# Patient Record
Sex: Female | Born: 1935
Health system: Southern US, Community
[De-identification: ages and names within clinical notes are randomized; demographics above are authoritative.]

---

## 2011-11-14 DIAGNOSIS — E039 Hypothyroidism, unspecified: Secondary | ICD-10-CM | POA: Diagnosis not present

## 2011-11-14 DIAGNOSIS — I10 Essential (primary) hypertension: Secondary | ICD-10-CM | POA: Diagnosis not present

## 2011-11-14 DIAGNOSIS — R5381 Other malaise: Secondary | ICD-10-CM | POA: Diagnosis not present

## 2011-11-14 DIAGNOSIS — E559 Vitamin D deficiency, unspecified: Secondary | ICD-10-CM | POA: Diagnosis not present

## 2011-11-14 DIAGNOSIS — D539 Nutritional anemia, unspecified: Secondary | ICD-10-CM | POA: Diagnosis not present

## 2011-11-14 DIAGNOSIS — E785 Hyperlipidemia, unspecified: Secondary | ICD-10-CM | POA: Diagnosis not present

## 2011-11-14 DIAGNOSIS — Z0000 Encounter for general adult medical examination without abnormal findings: Secondary | ICD-10-CM | POA: Diagnosis not present

## 2011-11-21 DIAGNOSIS — Z6832 Body mass index (BMI) 32.0-32.9, adult: Secondary | ICD-10-CM | POA: Diagnosis not present

## 2011-11-21 DIAGNOSIS — J069 Acute upper respiratory infection, unspecified: Secondary | ICD-10-CM | POA: Diagnosis not present

## 2011-11-21 DIAGNOSIS — D539 Nutritional anemia, unspecified: Secondary | ICD-10-CM | POA: Diagnosis not present

## 2011-11-21 DIAGNOSIS — K219 Gastro-esophageal reflux disease without esophagitis: Secondary | ICD-10-CM | POA: Diagnosis not present

## 2011-11-27 DIAGNOSIS — D510 Vitamin B12 deficiency anemia due to intrinsic factor deficiency: Secondary | ICD-10-CM | POA: Diagnosis not present

## 2011-12-04 DIAGNOSIS — D510 Vitamin B12 deficiency anemia due to intrinsic factor deficiency: Secondary | ICD-10-CM | POA: Diagnosis not present

## 2011-12-11 DIAGNOSIS — D510 Vitamin B12 deficiency anemia due to intrinsic factor deficiency: Secondary | ICD-10-CM | POA: Diagnosis not present

## 2011-12-11 DIAGNOSIS — Z6833 Body mass index (BMI) 33.0-33.9, adult: Secondary | ICD-10-CM | POA: Diagnosis not present

## 2012-01-02 DIAGNOSIS — H26499 Other secondary cataract, unspecified eye: Secondary | ICD-10-CM | POA: Diagnosis not present

## 2012-01-15 DIAGNOSIS — D510 Vitamin B12 deficiency anemia due to intrinsic factor deficiency: Secondary | ICD-10-CM | POA: Diagnosis not present

## 2012-01-22 DIAGNOSIS — Z1231 Encounter for screening mammogram for malignant neoplasm of breast: Secondary | ICD-10-CM | POA: Diagnosis not present

## 2012-02-19 DIAGNOSIS — D510 Vitamin B12 deficiency anemia due to intrinsic factor deficiency: Secondary | ICD-10-CM | POA: Diagnosis not present

## 2012-03-02 DIAGNOSIS — J019 Acute sinusitis, unspecified: Secondary | ICD-10-CM | POA: Diagnosis not present

## 2012-03-02 DIAGNOSIS — Z6832 Body mass index (BMI) 32.0-32.9, adult: Secondary | ICD-10-CM | POA: Diagnosis not present

## 2012-03-23 DIAGNOSIS — E039 Hypothyroidism, unspecified: Secondary | ICD-10-CM | POA: Diagnosis not present

## 2012-03-23 DIAGNOSIS — E785 Hyperlipidemia, unspecified: Secondary | ICD-10-CM | POA: Diagnosis not present

## 2012-03-23 DIAGNOSIS — I10 Essential (primary) hypertension: Secondary | ICD-10-CM | POA: Diagnosis not present

## 2012-03-23 DIAGNOSIS — D539 Nutritional anemia, unspecified: Secondary | ICD-10-CM | POA: Diagnosis not present

## 2012-03-23 DIAGNOSIS — Z6832 Body mass index (BMI) 32.0-32.9, adult: Secondary | ICD-10-CM | POA: Diagnosis not present

## 2012-03-23 DIAGNOSIS — D510 Vitamin B12 deficiency anemia due to intrinsic factor deficiency: Secondary | ICD-10-CM | POA: Diagnosis not present

## 2012-04-01 DIAGNOSIS — J449 Chronic obstructive pulmonary disease, unspecified: Secondary | ICD-10-CM | POA: Diagnosis not present

## 2012-04-01 DIAGNOSIS — R0982 Postnasal drip: Secondary | ICD-10-CM | POA: Diagnosis not present

## 2012-04-01 DIAGNOSIS — R0902 Hypoxemia: Secondary | ICD-10-CM | POA: Diagnosis not present

## 2012-04-29 DIAGNOSIS — D510 Vitamin B12 deficiency anemia due to intrinsic factor deficiency: Secondary | ICD-10-CM | POA: Diagnosis not present

## 2012-06-03 DIAGNOSIS — D510 Vitamin B12 deficiency anemia due to intrinsic factor deficiency: Secondary | ICD-10-CM | POA: Diagnosis not present

## 2012-07-15 DIAGNOSIS — D510 Vitamin B12 deficiency anemia due to intrinsic factor deficiency: Secondary | ICD-10-CM | POA: Diagnosis not present

## 2012-07-29 DIAGNOSIS — Z6832 Body mass index (BMI) 32.0-32.9, adult: Secondary | ICD-10-CM | POA: Diagnosis not present

## 2012-07-29 DIAGNOSIS — E785 Hyperlipidemia, unspecified: Secondary | ICD-10-CM | POA: Diagnosis not present

## 2012-07-29 DIAGNOSIS — D539 Nutritional anemia, unspecified: Secondary | ICD-10-CM | POA: Diagnosis not present

## 2012-07-29 DIAGNOSIS — M5382 Other specified dorsopathies, cervical region: Secondary | ICD-10-CM | POA: Diagnosis not present

## 2012-07-29 DIAGNOSIS — Z23 Encounter for immunization: Secondary | ICD-10-CM | POA: Diagnosis not present

## 2012-07-29 DIAGNOSIS — I10 Essential (primary) hypertension: Secondary | ICD-10-CM | POA: Diagnosis not present

## 2012-08-12 DIAGNOSIS — M542 Cervicalgia: Secondary | ICD-10-CM | POA: Diagnosis not present

## 2012-08-14 DIAGNOSIS — M542 Cervicalgia: Secondary | ICD-10-CM | POA: Diagnosis not present

## 2012-08-17 DIAGNOSIS — M542 Cervicalgia: Secondary | ICD-10-CM | POA: Diagnosis not present

## 2012-08-19 DIAGNOSIS — M542 Cervicalgia: Secondary | ICD-10-CM | POA: Diagnosis not present

## 2012-08-19 DIAGNOSIS — D510 Vitamin B12 deficiency anemia due to intrinsic factor deficiency: Secondary | ICD-10-CM | POA: Diagnosis not present

## 2012-08-24 DIAGNOSIS — M542 Cervicalgia: Secondary | ICD-10-CM | POA: Diagnosis not present

## 2012-08-26 DIAGNOSIS — M542 Cervicalgia: Secondary | ICD-10-CM | POA: Diagnosis not present

## 2012-08-28 DIAGNOSIS — M542 Cervicalgia: Secondary | ICD-10-CM | POA: Diagnosis not present

## 2012-09-30 DIAGNOSIS — D510 Vitamin B12 deficiency anemia due to intrinsic factor deficiency: Secondary | ICD-10-CM | POA: Diagnosis not present

## 2012-11-04 DIAGNOSIS — J449 Chronic obstructive pulmonary disease, unspecified: Secondary | ICD-10-CM | POA: Diagnosis not present

## 2012-11-04 DIAGNOSIS — R0982 Postnasal drip: Secondary | ICD-10-CM | POA: Diagnosis not present

## 2012-11-04 DIAGNOSIS — R0902 Hypoxemia: Secondary | ICD-10-CM | POA: Diagnosis not present

## 2012-12-02 DIAGNOSIS — E785 Hyperlipidemia, unspecified: Secondary | ICD-10-CM | POA: Diagnosis not present

## 2012-12-02 DIAGNOSIS — Z6833 Body mass index (BMI) 33.0-33.9, adult: Secondary | ICD-10-CM | POA: Diagnosis not present

## 2012-12-02 DIAGNOSIS — I10 Essential (primary) hypertension: Secondary | ICD-10-CM | POA: Diagnosis not present

## 2012-12-02 DIAGNOSIS — D539 Nutritional anemia, unspecified: Secondary | ICD-10-CM | POA: Diagnosis not present

## 2012-12-02 DIAGNOSIS — D510 Vitamin B12 deficiency anemia due to intrinsic factor deficiency: Secondary | ICD-10-CM | POA: Diagnosis not present

## 2012-12-02 DIAGNOSIS — E039 Hypothyroidism, unspecified: Secondary | ICD-10-CM | POA: Diagnosis not present

## 2012-12-02 DIAGNOSIS — E559 Vitamin D deficiency, unspecified: Secondary | ICD-10-CM | POA: Diagnosis not present

## 2013-01-05 DIAGNOSIS — H26499 Other secondary cataract, unspecified eye: Secondary | ICD-10-CM | POA: Diagnosis not present

## 2013-01-06 DIAGNOSIS — D510 Vitamin B12 deficiency anemia due to intrinsic factor deficiency: Secondary | ICD-10-CM | POA: Diagnosis not present

## 2013-01-19 DIAGNOSIS — H524 Presbyopia: Secondary | ICD-10-CM | POA: Diagnosis not present

## 2013-02-10 DIAGNOSIS — D510 Vitamin B12 deficiency anemia due to intrinsic factor deficiency: Secondary | ICD-10-CM | POA: Diagnosis not present

## 2013-03-17 DIAGNOSIS — D510 Vitamin B12 deficiency anemia due to intrinsic factor deficiency: Secondary | ICD-10-CM | POA: Diagnosis not present

## 2013-04-07 DIAGNOSIS — Z1331 Encounter for screening for depression: Secondary | ICD-10-CM | POA: Diagnosis not present

## 2013-04-07 DIAGNOSIS — E039 Hypothyroidism, unspecified: Secondary | ICD-10-CM | POA: Diagnosis not present

## 2013-04-07 DIAGNOSIS — Z1212 Encounter for screening for malignant neoplasm of rectum: Secondary | ICD-10-CM | POA: Diagnosis not present

## 2013-04-07 DIAGNOSIS — Z9181 History of falling: Secondary | ICD-10-CM | POA: Diagnosis not present

## 2013-04-07 DIAGNOSIS — Z0000 Encounter for general adult medical examination without abnormal findings: Secondary | ICD-10-CM | POA: Diagnosis not present

## 2013-04-07 DIAGNOSIS — I10 Essential (primary) hypertension: Secondary | ICD-10-CM | POA: Diagnosis not present

## 2013-04-07 DIAGNOSIS — D509 Iron deficiency anemia, unspecified: Secondary | ICD-10-CM | POA: Diagnosis not present

## 2013-04-07 DIAGNOSIS — E785 Hyperlipidemia, unspecified: Secondary | ICD-10-CM | POA: Diagnosis not present

## 2013-04-28 DIAGNOSIS — D510 Vitamin B12 deficiency anemia due to intrinsic factor deficiency: Secondary | ICD-10-CM | POA: Diagnosis not present

## 2013-06-02 DIAGNOSIS — K117 Disturbances of salivary secretion: Secondary | ICD-10-CM | POA: Diagnosis not present

## 2013-06-02 DIAGNOSIS — Z6831 Body mass index (BMI) 31.0-31.9, adult: Secondary | ICD-10-CM | POA: Diagnosis not present

## 2013-06-02 DIAGNOSIS — D510 Vitamin B12 deficiency anemia due to intrinsic factor deficiency: Secondary | ICD-10-CM | POA: Diagnosis not present

## 2013-06-02 DIAGNOSIS — D539 Nutritional anemia, unspecified: Secondary | ICD-10-CM | POA: Diagnosis not present

## 2013-06-10 DIAGNOSIS — J449 Chronic obstructive pulmonary disease, unspecified: Secondary | ICD-10-CM | POA: Diagnosis not present

## 2013-06-10 DIAGNOSIS — J310 Chronic rhinitis: Secondary | ICD-10-CM | POA: Diagnosis not present

## 2013-06-10 DIAGNOSIS — R0902 Hypoxemia: Secondary | ICD-10-CM | POA: Diagnosis not present

## 2013-06-10 DIAGNOSIS — R05 Cough: Secondary | ICD-10-CM | POA: Diagnosis not present

## 2013-06-10 DIAGNOSIS — Z006 Encounter for examination for normal comparison and control in clinical research program: Secondary | ICD-10-CM | POA: Diagnosis not present

## 2013-06-10 DIAGNOSIS — R0982 Postnasal drip: Secondary | ICD-10-CM | POA: Diagnosis not present

## 2013-08-09 DIAGNOSIS — I10 Essential (primary) hypertension: Secondary | ICD-10-CM | POA: Diagnosis not present

## 2013-08-09 DIAGNOSIS — D510 Vitamin B12 deficiency anemia due to intrinsic factor deficiency: Secondary | ICD-10-CM | POA: Diagnosis not present

## 2013-08-09 DIAGNOSIS — Z23 Encounter for immunization: Secondary | ICD-10-CM | POA: Diagnosis not present

## 2013-08-09 DIAGNOSIS — Z6831 Body mass index (BMI) 31.0-31.9, adult: Secondary | ICD-10-CM | POA: Diagnosis not present

## 2013-08-09 DIAGNOSIS — E785 Hyperlipidemia, unspecified: Secondary | ICD-10-CM | POA: Diagnosis not present

## 2013-12-14 DIAGNOSIS — E785 Hyperlipidemia, unspecified: Secondary | ICD-10-CM | POA: Diagnosis not present

## 2013-12-14 DIAGNOSIS — E039 Hypothyroidism, unspecified: Secondary | ICD-10-CM | POA: Diagnosis not present

## 2013-12-14 DIAGNOSIS — Z6830 Body mass index (BMI) 30.0-30.9, adult: Secondary | ICD-10-CM | POA: Diagnosis not present

## 2013-12-14 DIAGNOSIS — I10 Essential (primary) hypertension: Secondary | ICD-10-CM | POA: Diagnosis not present

## 2013-12-14 DIAGNOSIS — D510 Vitamin B12 deficiency anemia due to intrinsic factor deficiency: Secondary | ICD-10-CM | POA: Diagnosis not present

## 2013-12-14 DIAGNOSIS — E559 Vitamin D deficiency, unspecified: Secondary | ICD-10-CM | POA: Diagnosis not present

## 2013-12-15 DIAGNOSIS — R0982 Postnasal drip: Secondary | ICD-10-CM | POA: Diagnosis not present

## 2013-12-15 DIAGNOSIS — R0902 Hypoxemia: Secondary | ICD-10-CM | POA: Diagnosis not present

## 2013-12-15 DIAGNOSIS — J449 Chronic obstructive pulmonary disease, unspecified: Secondary | ICD-10-CM | POA: Diagnosis not present

## 2014-01-06 DIAGNOSIS — H26499 Other secondary cataract, unspecified eye: Secondary | ICD-10-CM | POA: Diagnosis not present

## 2014-01-06 DIAGNOSIS — H524 Presbyopia: Secondary | ICD-10-CM | POA: Diagnosis not present

## 2014-01-26 DIAGNOSIS — D510 Vitamin B12 deficiency anemia due to intrinsic factor deficiency: Secondary | ICD-10-CM | POA: Diagnosis not present

## 2014-01-28 DIAGNOSIS — R0902 Hypoxemia: Secondary | ICD-10-CM | POA: Diagnosis not present

## 2014-01-28 DIAGNOSIS — R0982 Postnasal drip: Secondary | ICD-10-CM | POA: Diagnosis not present

## 2014-01-28 DIAGNOSIS — J449 Chronic obstructive pulmonary disease, unspecified: Secondary | ICD-10-CM | POA: Diagnosis not present

## 2014-03-09 DIAGNOSIS — D510 Vitamin B12 deficiency anemia due to intrinsic factor deficiency: Secondary | ICD-10-CM | POA: Diagnosis not present

## 2014-04-27 DIAGNOSIS — D510 Vitamin B12 deficiency anemia due to intrinsic factor deficiency: Secondary | ICD-10-CM | POA: Diagnosis not present

## 2014-04-27 DIAGNOSIS — Z9181 History of falling: Secondary | ICD-10-CM | POA: Diagnosis not present

## 2014-04-27 DIAGNOSIS — J449 Chronic obstructive pulmonary disease, unspecified: Secondary | ICD-10-CM | POA: Diagnosis not present

## 2014-04-27 DIAGNOSIS — Z1331 Encounter for screening for depression: Secondary | ICD-10-CM | POA: Diagnosis not present

## 2014-04-27 DIAGNOSIS — E785 Hyperlipidemia, unspecified: Secondary | ICD-10-CM | POA: Diagnosis not present

## 2014-04-27 DIAGNOSIS — Z6830 Body mass index (BMI) 30.0-30.9, adult: Secondary | ICD-10-CM | POA: Diagnosis not present

## 2014-04-27 DIAGNOSIS — M5430 Sciatica, unspecified side: Secondary | ICD-10-CM | POA: Diagnosis not present

## 2014-04-27 DIAGNOSIS — I10 Essential (primary) hypertension: Secondary | ICD-10-CM | POA: Diagnosis not present

## 2014-06-01 DIAGNOSIS — D510 Vitamin B12 deficiency anemia due to intrinsic factor deficiency: Secondary | ICD-10-CM | POA: Diagnosis not present

## 2014-06-13 DIAGNOSIS — M949 Disorder of cartilage, unspecified: Secondary | ICD-10-CM | POA: Diagnosis not present

## 2014-06-13 DIAGNOSIS — Z1231 Encounter for screening mammogram for malignant neoplasm of breast: Secondary | ICD-10-CM | POA: Diagnosis not present

## 2014-06-13 DIAGNOSIS — Z13820 Encounter for screening for osteoporosis: Secondary | ICD-10-CM | POA: Diagnosis not present

## 2014-06-13 DIAGNOSIS — M899 Disorder of bone, unspecified: Secondary | ICD-10-CM | POA: Diagnosis not present

## 2014-06-22 DIAGNOSIS — R0902 Hypoxemia: Secondary | ICD-10-CM | POA: Diagnosis not present

## 2014-06-22 DIAGNOSIS — J449 Chronic obstructive pulmonary disease, unspecified: Secondary | ICD-10-CM | POA: Diagnosis not present

## 2014-07-06 DIAGNOSIS — D510 Vitamin B12 deficiency anemia due to intrinsic factor deficiency: Secondary | ICD-10-CM | POA: Diagnosis not present

## 2014-08-08 DIAGNOSIS — D509 Iron deficiency anemia, unspecified: Secondary | ICD-10-CM | POA: Diagnosis not present

## 2014-09-12 DIAGNOSIS — K219 Gastro-esophageal reflux disease without esophagitis: Secondary | ICD-10-CM | POA: Diagnosis not present

## 2014-09-12 DIAGNOSIS — M8580 Other specified disorders of bone density and structure, unspecified site: Secondary | ICD-10-CM | POA: Diagnosis not present

## 2014-09-12 DIAGNOSIS — Z23 Encounter for immunization: Secondary | ICD-10-CM | POA: Diagnosis not present

## 2014-09-12 DIAGNOSIS — I10 Essential (primary) hypertension: Secondary | ICD-10-CM | POA: Diagnosis not present

## 2014-09-12 DIAGNOSIS — D509 Iron deficiency anemia, unspecified: Secondary | ICD-10-CM | POA: Diagnosis not present

## 2014-09-12 DIAGNOSIS — E785 Hyperlipidemia, unspecified: Secondary | ICD-10-CM | POA: Diagnosis not present

## 2014-09-12 DIAGNOSIS — J449 Chronic obstructive pulmonary disease, unspecified: Secondary | ICD-10-CM | POA: Diagnosis not present

## 2014-09-12 DIAGNOSIS — E039 Hypothyroidism, unspecified: Secondary | ICD-10-CM | POA: Diagnosis not present

## 2014-10-19 DIAGNOSIS — D510 Vitamin B12 deficiency anemia due to intrinsic factor deficiency: Secondary | ICD-10-CM | POA: Diagnosis not present

## 2014-10-26 DIAGNOSIS — R0982 Postnasal drip: Secondary | ICD-10-CM | POA: Diagnosis not present

## 2014-10-26 DIAGNOSIS — J449 Chronic obstructive pulmonary disease, unspecified: Secondary | ICD-10-CM | POA: Diagnosis not present

## 2014-10-26 DIAGNOSIS — R0902 Hypoxemia: Secondary | ICD-10-CM | POA: Diagnosis not present

## 2014-11-23 DIAGNOSIS — D510 Vitamin B12 deficiency anemia due to intrinsic factor deficiency: Secondary | ICD-10-CM | POA: Diagnosis not present

## 2014-12-26 DIAGNOSIS — D510 Vitamin B12 deficiency anemia due to intrinsic factor deficiency: Secondary | ICD-10-CM | POA: Diagnosis not present

## 2015-01-12 DIAGNOSIS — H26493 Other secondary cataract, bilateral: Secondary | ICD-10-CM | POA: Diagnosis not present

## 2015-01-12 DIAGNOSIS — H524 Presbyopia: Secondary | ICD-10-CM | POA: Diagnosis not present

## 2015-01-27 DIAGNOSIS — E785 Hyperlipidemia, unspecified: Secondary | ICD-10-CM | POA: Diagnosis not present

## 2015-01-27 DIAGNOSIS — F329 Major depressive disorder, single episode, unspecified: Secondary | ICD-10-CM | POA: Diagnosis not present

## 2015-01-27 DIAGNOSIS — J449 Chronic obstructive pulmonary disease, unspecified: Secondary | ICD-10-CM | POA: Diagnosis not present

## 2015-01-27 DIAGNOSIS — M8580 Other specified disorders of bone density and structure, unspecified site: Secondary | ICD-10-CM | POA: Diagnosis not present

## 2015-01-27 DIAGNOSIS — Z6829 Body mass index (BMI) 29.0-29.9, adult: Secondary | ICD-10-CM | POA: Diagnosis not present

## 2015-01-27 DIAGNOSIS — I10 Essential (primary) hypertension: Secondary | ICD-10-CM | POA: Diagnosis not present

## 2015-01-27 DIAGNOSIS — D510 Vitamin B12 deficiency anemia due to intrinsic factor deficiency: Secondary | ICD-10-CM | POA: Diagnosis not present

## 2015-01-27 DIAGNOSIS — J309 Allergic rhinitis, unspecified: Secondary | ICD-10-CM | POA: Diagnosis not present

## 2015-02-27 DIAGNOSIS — D510 Vitamin B12 deficiency anemia due to intrinsic factor deficiency: Secondary | ICD-10-CM | POA: Diagnosis not present

## 2015-04-03 DIAGNOSIS — F329 Major depressive disorder, single episode, unspecified: Secondary | ICD-10-CM | POA: Diagnosis not present

## 2015-04-03 DIAGNOSIS — Z6829 Body mass index (BMI) 29.0-29.9, adult: Secondary | ICD-10-CM | POA: Diagnosis not present

## 2015-04-03 DIAGNOSIS — D510 Vitamin B12 deficiency anemia due to intrinsic factor deficiency: Secondary | ICD-10-CM | POA: Diagnosis not present

## 2015-04-03 DIAGNOSIS — I10 Essential (primary) hypertension: Secondary | ICD-10-CM | POA: Diagnosis not present

## 2015-04-03 DIAGNOSIS — R5381 Other malaise: Secondary | ICD-10-CM | POA: Diagnosis not present

## 2015-05-05 DIAGNOSIS — E559 Vitamin D deficiency, unspecified: Secondary | ICD-10-CM | POA: Diagnosis not present

## 2015-06-05 DIAGNOSIS — Z6828 Body mass index (BMI) 28.0-28.9, adult: Secondary | ICD-10-CM | POA: Diagnosis not present

## 2015-06-05 DIAGNOSIS — E785 Hyperlipidemia, unspecified: Secondary | ICD-10-CM | POA: Diagnosis not present

## 2015-06-05 DIAGNOSIS — J358 Other chronic diseases of tonsils and adenoids: Secondary | ICD-10-CM | POA: Diagnosis not present

## 2015-06-05 DIAGNOSIS — I10 Essential (primary) hypertension: Secondary | ICD-10-CM | POA: Diagnosis not present

## 2015-06-05 DIAGNOSIS — J309 Allergic rhinitis, unspecified: Secondary | ICD-10-CM | POA: Diagnosis not present

## 2015-06-05 DIAGNOSIS — Z9181 History of falling: Secondary | ICD-10-CM | POA: Diagnosis not present

## 2015-06-05 DIAGNOSIS — J449 Chronic obstructive pulmonary disease, unspecified: Secondary | ICD-10-CM | POA: Diagnosis not present

## 2015-06-05 DIAGNOSIS — D510 Vitamin B12 deficiency anemia due to intrinsic factor deficiency: Secondary | ICD-10-CM | POA: Diagnosis not present

## 2015-07-07 DIAGNOSIS — D510 Vitamin B12 deficiency anemia due to intrinsic factor deficiency: Secondary | ICD-10-CM | POA: Diagnosis not present

## 2015-07-07 DIAGNOSIS — J358 Other chronic diseases of tonsils and adenoids: Secondary | ICD-10-CM | POA: Diagnosis not present

## 2015-07-07 DIAGNOSIS — Z6828 Body mass index (BMI) 28.0-28.9, adult: Secondary | ICD-10-CM | POA: Diagnosis not present

## 2015-08-09 DIAGNOSIS — D510 Vitamin B12 deficiency anemia due to intrinsic factor deficiency: Secondary | ICD-10-CM | POA: Diagnosis not present

## 2015-09-20 DIAGNOSIS — Z6827 Body mass index (BMI) 27.0-27.9, adult: Secondary | ICD-10-CM | POA: Diagnosis not present

## 2015-09-20 DIAGNOSIS — M159 Polyosteoarthritis, unspecified: Secondary | ICD-10-CM | POA: Diagnosis not present

## 2015-09-20 DIAGNOSIS — Z23 Encounter for immunization: Secondary | ICD-10-CM | POA: Diagnosis not present

## 2015-09-20 DIAGNOSIS — E785 Hyperlipidemia, unspecified: Secondary | ICD-10-CM | POA: Diagnosis not present

## 2015-09-20 DIAGNOSIS — D510 Vitamin B12 deficiency anemia due to intrinsic factor deficiency: Secondary | ICD-10-CM | POA: Diagnosis not present

## 2015-09-20 DIAGNOSIS — J449 Chronic obstructive pulmonary disease, unspecified: Secondary | ICD-10-CM | POA: Diagnosis not present

## 2015-09-20 DIAGNOSIS — I10 Essential (primary) hypertension: Secondary | ICD-10-CM | POA: Diagnosis not present

## 2015-09-20 DIAGNOSIS — R5381 Other malaise: Secondary | ICD-10-CM | POA: Diagnosis not present

## 2015-09-29 DIAGNOSIS — J449 Chronic obstructive pulmonary disease, unspecified: Secondary | ICD-10-CM | POA: Diagnosis not present

## 2015-09-29 DIAGNOSIS — R0982 Postnasal drip: Secondary | ICD-10-CM | POA: Diagnosis not present

## 2015-09-29 DIAGNOSIS — R0902 Hypoxemia: Secondary | ICD-10-CM | POA: Diagnosis not present

## 2015-11-02 DIAGNOSIS — R634 Abnormal weight loss: Secondary | ICD-10-CM | POA: Diagnosis not present

## 2015-11-02 DIAGNOSIS — Z6826 Body mass index (BMI) 26.0-26.9, adult: Secondary | ICD-10-CM | POA: Diagnosis not present

## 2015-11-02 DIAGNOSIS — D510 Vitamin B12 deficiency anemia due to intrinsic factor deficiency: Secondary | ICD-10-CM | POA: Diagnosis not present

## 2015-11-02 DIAGNOSIS — Z23 Encounter for immunization: Secondary | ICD-10-CM | POA: Diagnosis not present

## 2015-11-02 DIAGNOSIS — J449 Chronic obstructive pulmonary disease, unspecified: Secondary | ICD-10-CM | POA: Diagnosis not present

## 2015-11-08 DIAGNOSIS — R0982 Postnasal drip: Secondary | ICD-10-CM | POA: Diagnosis not present

## 2015-11-08 DIAGNOSIS — R0902 Hypoxemia: Secondary | ICD-10-CM | POA: Diagnosis not present

## 2015-11-08 DIAGNOSIS — J4520 Mild intermittent asthma, uncomplicated: Secondary | ICD-10-CM | POA: Diagnosis not present

## 2015-12-07 DIAGNOSIS — Z1389 Encounter for screening for other disorder: Secondary | ICD-10-CM | POA: Diagnosis not present

## 2015-12-07 DIAGNOSIS — Z6826 Body mass index (BMI) 26.0-26.9, adult: Secondary | ICD-10-CM | POA: Diagnosis not present

## 2015-12-07 DIAGNOSIS — R634 Abnormal weight loss: Secondary | ICD-10-CM | POA: Diagnosis not present

## 2015-12-07 DIAGNOSIS — D510 Vitamin B12 deficiency anemia due to intrinsic factor deficiency: Secondary | ICD-10-CM | POA: Diagnosis not present

## 2016-01-10 DIAGNOSIS — E785 Hyperlipidemia, unspecified: Secondary | ICD-10-CM | POA: Diagnosis not present

## 2016-01-10 DIAGNOSIS — D510 Vitamin B12 deficiency anemia due to intrinsic factor deficiency: Secondary | ICD-10-CM | POA: Diagnosis not present

## 2016-01-10 DIAGNOSIS — R634 Abnormal weight loss: Secondary | ICD-10-CM | POA: Diagnosis not present

## 2016-01-10 DIAGNOSIS — I10 Essential (primary) hypertension: Secondary | ICD-10-CM | POA: Diagnosis not present

## 2016-01-10 DIAGNOSIS — J309 Allergic rhinitis, unspecified: Secondary | ICD-10-CM | POA: Diagnosis not present

## 2016-01-10 DIAGNOSIS — Z6825 Body mass index (BMI) 25.0-25.9, adult: Secondary | ICD-10-CM | POA: Diagnosis not present

## 2016-01-10 DIAGNOSIS — Z1389 Encounter for screening for other disorder: Secondary | ICD-10-CM | POA: Diagnosis not present

## 2016-02-14 DIAGNOSIS — H26493 Other secondary cataract, bilateral: Secondary | ICD-10-CM | POA: Diagnosis not present

## 2016-02-20 DIAGNOSIS — D509 Iron deficiency anemia, unspecified: Secondary | ICD-10-CM | POA: Diagnosis not present

## 2016-03-26 DIAGNOSIS — M546 Pain in thoracic spine: Secondary | ICD-10-CM | POA: Diagnosis not present

## 2016-03-26 DIAGNOSIS — D510 Vitamin B12 deficiency anemia due to intrinsic factor deficiency: Secondary | ICD-10-CM | POA: Diagnosis not present

## 2016-03-26 DIAGNOSIS — Z6824 Body mass index (BMI) 24.0-24.9, adult: Secondary | ICD-10-CM | POA: Diagnosis not present

## 2016-05-16 DIAGNOSIS — E039 Hypothyroidism, unspecified: Secondary | ICD-10-CM | POA: Diagnosis not present

## 2016-05-16 DIAGNOSIS — J029 Acute pharyngitis, unspecified: Secondary | ICD-10-CM | POA: Diagnosis not present

## 2016-05-16 DIAGNOSIS — R5381 Other malaise: Secondary | ICD-10-CM | POA: Diagnosis not present

## 2016-05-16 DIAGNOSIS — M546 Pain in thoracic spine: Secondary | ICD-10-CM | POA: Diagnosis not present

## 2016-05-16 DIAGNOSIS — I10 Essential (primary) hypertension: Secondary | ICD-10-CM | POA: Diagnosis not present

## 2016-05-16 DIAGNOSIS — E785 Hyperlipidemia, unspecified: Secondary | ICD-10-CM | POA: Diagnosis not present

## 2016-05-16 DIAGNOSIS — Z87898 Personal history of other specified conditions: Secondary | ICD-10-CM | POA: Diagnosis not present

## 2016-05-16 DIAGNOSIS — D510 Vitamin B12 deficiency anemia due to intrinsic factor deficiency: Secondary | ICD-10-CM | POA: Diagnosis not present

## 2016-05-16 DIAGNOSIS — R634 Abnormal weight loss: Secondary | ICD-10-CM | POA: Diagnosis not present

## 2016-05-21 DIAGNOSIS — M419 Scoliosis, unspecified: Secondary | ICD-10-CM | POA: Diagnosis not present

## 2016-05-21 DIAGNOSIS — M47814 Spondylosis without myelopathy or radiculopathy, thoracic region: Secondary | ICD-10-CM | POA: Diagnosis not present

## 2016-05-21 DIAGNOSIS — R911 Solitary pulmonary nodule: Secondary | ICD-10-CM | POA: Diagnosis not present

## 2016-05-21 DIAGNOSIS — M8588 Other specified disorders of bone density and structure, other site: Secondary | ICD-10-CM | POA: Diagnosis not present

## 2016-05-21 DIAGNOSIS — M4854XA Collapsed vertebra, not elsewhere classified, thoracic region, initial encounter for fracture: Secondary | ICD-10-CM | POA: Diagnosis not present

## 2016-05-21 DIAGNOSIS — M40204 Unspecified kyphosis, thoracic region: Secondary | ICD-10-CM | POA: Diagnosis not present

## 2016-05-29 DIAGNOSIS — M4854XA Collapsed vertebra, not elsewhere classified, thoracic region, initial encounter for fracture: Secondary | ICD-10-CM | POA: Diagnosis not present

## 2016-05-29 DIAGNOSIS — M5124 Other intervertebral disc displacement, thoracic region: Secondary | ICD-10-CM | POA: Diagnosis not present

## 2016-05-30 DIAGNOSIS — R634 Abnormal weight loss: Secondary | ICD-10-CM | POA: Diagnosis not present

## 2016-05-30 DIAGNOSIS — M5414 Radiculopathy, thoracic region: Secondary | ICD-10-CM | POA: Diagnosis not present

## 2016-05-30 DIAGNOSIS — M546 Pain in thoracic spine: Secondary | ICD-10-CM | POA: Diagnosis not present

## 2016-06-18 DIAGNOSIS — D510 Vitamin B12 deficiency anemia due to intrinsic factor deficiency: Secondary | ICD-10-CM | POA: Diagnosis not present

## 2016-06-20 DIAGNOSIS — M47814 Spondylosis without myelopathy or radiculopathy, thoracic region: Secondary | ICD-10-CM | POA: Diagnosis not present

## 2016-06-20 DIAGNOSIS — M5414 Radiculopathy, thoracic region: Secondary | ICD-10-CM | POA: Diagnosis not present

## 2016-07-04 DIAGNOSIS — Z9181 History of falling: Secondary | ICD-10-CM | POA: Diagnosis not present

## 2016-07-04 DIAGNOSIS — R634 Abnormal weight loss: Secondary | ICD-10-CM | POA: Diagnosis not present

## 2016-07-04 DIAGNOSIS — M546 Pain in thoracic spine: Secondary | ICD-10-CM | POA: Diagnosis not present

## 2016-07-18 DIAGNOSIS — M47814 Spondylosis without myelopathy or radiculopathy, thoracic region: Secondary | ICD-10-CM | POA: Diagnosis not present

## 2016-07-18 DIAGNOSIS — M5414 Radiculopathy, thoracic region: Secondary | ICD-10-CM | POA: Diagnosis not present

## 2016-07-23 DIAGNOSIS — D509 Iron deficiency anemia, unspecified: Secondary | ICD-10-CM | POA: Diagnosis not present

## 2016-09-03 DIAGNOSIS — D509 Iron deficiency anemia, unspecified: Secondary | ICD-10-CM | POA: Diagnosis not present

## 2016-09-03 DIAGNOSIS — M159 Polyosteoarthritis, unspecified: Secondary | ICD-10-CM | POA: Diagnosis not present

## 2016-09-03 DIAGNOSIS — Z23 Encounter for immunization: Secondary | ICD-10-CM | POA: Diagnosis not present

## 2016-09-03 DIAGNOSIS — R634 Abnormal weight loss: Secondary | ICD-10-CM | POA: Diagnosis not present

## 2016-10-10 DIAGNOSIS — M5414 Radiculopathy, thoracic region: Secondary | ICD-10-CM | POA: Diagnosis not present

## 2016-10-10 DIAGNOSIS — M47814 Spondylosis without myelopathy or radiculopathy, thoracic region: Secondary | ICD-10-CM | POA: Diagnosis not present

## 2016-10-15 DIAGNOSIS — D510 Vitamin B12 deficiency anemia due to intrinsic factor deficiency: Secondary | ICD-10-CM | POA: Diagnosis not present

## 2016-10-15 DIAGNOSIS — R634 Abnormal weight loss: Secondary | ICD-10-CM | POA: Diagnosis not present

## 2016-10-15 DIAGNOSIS — K219 Gastro-esophageal reflux disease without esophagitis: Secondary | ICD-10-CM | POA: Diagnosis not present

## 2016-11-18 DIAGNOSIS — R911 Solitary pulmonary nodule: Secondary | ICD-10-CM | POA: Diagnosis not present

## 2016-11-18 DIAGNOSIS — E039 Hypothyroidism, unspecified: Secondary | ICD-10-CM | POA: Diagnosis not present

## 2016-11-18 DIAGNOSIS — K219 Gastro-esophageal reflux disease without esophagitis: Secondary | ICD-10-CM | POA: Diagnosis not present

## 2016-11-18 DIAGNOSIS — E785 Hyperlipidemia, unspecified: Secondary | ICD-10-CM | POA: Diagnosis not present

## 2016-11-18 DIAGNOSIS — R634 Abnormal weight loss: Secondary | ICD-10-CM | POA: Diagnosis not present

## 2016-11-18 DIAGNOSIS — D510 Vitamin B12 deficiency anemia due to intrinsic factor deficiency: Secondary | ICD-10-CM | POA: Diagnosis not present

## 2016-11-18 DIAGNOSIS — R5381 Other malaise: Secondary | ICD-10-CM | POA: Diagnosis not present

## 2016-11-22 DIAGNOSIS — K449 Diaphragmatic hernia without obstruction or gangrene: Secondary | ICD-10-CM | POA: Diagnosis not present

## 2016-11-22 DIAGNOSIS — R918 Other nonspecific abnormal finding of lung field: Secondary | ICD-10-CM | POA: Diagnosis not present

## 2016-11-22 DIAGNOSIS — R911 Solitary pulmonary nodule: Secondary | ICD-10-CM | POA: Diagnosis not present

## 2017-02-18 DIAGNOSIS — R634 Abnormal weight loss: Secondary | ICD-10-CM | POA: Diagnosis not present

## 2017-02-18 DIAGNOSIS — J449 Chronic obstructive pulmonary disease, unspecified: Secondary | ICD-10-CM | POA: Diagnosis not present

## 2017-02-18 DIAGNOSIS — E039 Hypothyroidism, unspecified: Secondary | ICD-10-CM | POA: Diagnosis not present

## 2017-02-18 DIAGNOSIS — D210 Benign neoplasm of connective and other soft tissue of head, face and neck: Secondary | ICD-10-CM | POA: Diagnosis not present

## 2017-02-18 DIAGNOSIS — Z6820 Body mass index (BMI) 20.0-20.9, adult: Secondary | ICD-10-CM | POA: Diagnosis not present

## 2017-02-18 DIAGNOSIS — E785 Hyperlipidemia, unspecified: Secondary | ICD-10-CM | POA: Diagnosis not present

## 2017-02-18 DIAGNOSIS — F329 Major depressive disorder, single episode, unspecified: Secondary | ICD-10-CM | POA: Diagnosis not present

## 2017-02-18 DIAGNOSIS — R5381 Other malaise: Secondary | ICD-10-CM | POA: Diagnosis not present

## 2017-02-18 DIAGNOSIS — K219 Gastro-esophageal reflux disease without esophagitis: Secondary | ICD-10-CM | POA: Diagnosis not present

## 2017-02-18 DIAGNOSIS — D510 Vitamin B12 deficiency anemia due to intrinsic factor deficiency: Secondary | ICD-10-CM | POA: Diagnosis not present

## 2017-03-24 DIAGNOSIS — R634 Abnormal weight loss: Secondary | ICD-10-CM | POA: Diagnosis not present

## 2017-03-24 DIAGNOSIS — D510 Vitamin B12 deficiency anemia due to intrinsic factor deficiency: Secondary | ICD-10-CM | POA: Diagnosis not present

## 2017-03-24 DIAGNOSIS — M159 Polyosteoarthritis, unspecified: Secondary | ICD-10-CM | POA: Diagnosis not present

## 2017-04-07 DIAGNOSIS — D510 Vitamin B12 deficiency anemia due to intrinsic factor deficiency: Secondary | ICD-10-CM | POA: Diagnosis not present

## 2017-04-07 DIAGNOSIS — M5414 Radiculopathy, thoracic region: Secondary | ICD-10-CM | POA: Diagnosis not present

## 2017-04-07 DIAGNOSIS — R634 Abnormal weight loss: Secondary | ICD-10-CM | POA: Diagnosis not present

## 2017-04-24 DIAGNOSIS — D510 Vitamin B12 deficiency anemia due to intrinsic factor deficiency: Secondary | ICD-10-CM | POA: Diagnosis not present

## 2017-05-08 DIAGNOSIS — D510 Vitamin B12 deficiency anemia due to intrinsic factor deficiency: Secondary | ICD-10-CM | POA: Diagnosis not present

## 2017-05-08 DIAGNOSIS — M5414 Radiculopathy, thoracic region: Secondary | ICD-10-CM | POA: Diagnosis not present

## 2017-05-08 DIAGNOSIS — R634 Abnormal weight loss: Secondary | ICD-10-CM | POA: Diagnosis not present

## 2017-05-22 DIAGNOSIS — D510 Vitamin B12 deficiency anemia due to intrinsic factor deficiency: Secondary | ICD-10-CM | POA: Diagnosis not present

## 2017-06-05 DIAGNOSIS — R634 Abnormal weight loss: Secondary | ICD-10-CM | POA: Diagnosis not present

## 2017-06-05 DIAGNOSIS — D510 Vitamin B12 deficiency anemia due to intrinsic factor deficiency: Secondary | ICD-10-CM | POA: Diagnosis not present

## 2017-06-05 DIAGNOSIS — R911 Solitary pulmonary nodule: Secondary | ICD-10-CM | POA: Diagnosis not present

## 2017-06-18 DIAGNOSIS — R590 Localized enlarged lymph nodes: Secondary | ICD-10-CM | POA: Diagnosis not present

## 2017-06-18 DIAGNOSIS — I700 Atherosclerosis of aorta: Secondary | ICD-10-CM | POA: Diagnosis not present

## 2017-06-18 DIAGNOSIS — R911 Solitary pulmonary nodule: Secondary | ICD-10-CM | POA: Diagnosis not present

## 2017-06-18 DIAGNOSIS — R918 Other nonspecific abnormal finding of lung field: Secondary | ICD-10-CM | POA: Diagnosis not present

## 2017-06-18 DIAGNOSIS — J439 Emphysema, unspecified: Secondary | ICD-10-CM | POA: Diagnosis not present

## 2017-06-19 DIAGNOSIS — R221 Localized swelling, mass and lump, neck: Secondary | ICD-10-CM | POA: Diagnosis not present

## 2017-06-19 DIAGNOSIS — R918 Other nonspecific abnormal finding of lung field: Secondary | ICD-10-CM | POA: Diagnosis not present

## 2017-06-19 DIAGNOSIS — R634 Abnormal weight loss: Secondary | ICD-10-CM | POA: Diagnosis not present

## 2017-06-24 DIAGNOSIS — R51 Headache: Secondary | ICD-10-CM | POA: Diagnosis not present

## 2017-06-24 DIAGNOSIS — R221 Localized swelling, mass and lump, neck: Secondary | ICD-10-CM | POA: Diagnosis not present

## 2017-07-03 DIAGNOSIS — R221 Localized swelling, mass and lump, neck: Secondary | ICD-10-CM | POA: Diagnosis not present

## 2017-07-03 DIAGNOSIS — C8331 Diffuse large B-cell lymphoma, lymph nodes of head, face, and neck: Secondary | ICD-10-CM | POA: Diagnosis not present

## 2017-07-09 DIAGNOSIS — J449 Chronic obstructive pulmonary disease, unspecified: Secondary | ICD-10-CM | POA: Diagnosis not present

## 2017-07-09 DIAGNOSIS — K219 Gastro-esophageal reflux disease without esophagitis: Secondary | ICD-10-CM | POA: Diagnosis not present

## 2017-07-09 DIAGNOSIS — C8330 Diffuse large B-cell lymphoma, unspecified site: Secondary | ICD-10-CM | POA: Diagnosis not present

## 2017-07-09 DIAGNOSIS — E7800 Pure hypercholesterolemia, unspecified: Secondary | ICD-10-CM | POA: Diagnosis not present

## 2017-07-09 DIAGNOSIS — R634 Abnormal weight loss: Secondary | ICD-10-CM | POA: Diagnosis not present

## 2017-07-09 DIAGNOSIS — E039 Hypothyroidism, unspecified: Secondary | ICD-10-CM | POA: Diagnosis not present

## 2017-07-09 DIAGNOSIS — I10 Essential (primary) hypertension: Secondary | ICD-10-CM | POA: Diagnosis not present

## 2017-07-09 DIAGNOSIS — Z79899 Other long term (current) drug therapy: Secondary | ICD-10-CM | POA: Diagnosis not present

## 2017-07-09 DIAGNOSIS — C7800 Secondary malignant neoplasm of unspecified lung: Secondary | ICD-10-CM | POA: Diagnosis not present

## 2017-07-09 DIAGNOSIS — C8331 Diffuse large B-cell lymphoma, lymph nodes of head, face, and neck: Secondary | ICD-10-CM | POA: Diagnosis not present

## 2017-07-10 DIAGNOSIS — C8101 Nodular lymphocyte predominant Hodgkin lymphoma, lymph nodes of head, face, and neck: Secondary | ICD-10-CM | POA: Diagnosis not present

## 2017-07-11 DIAGNOSIS — T451X1A Poisoning by antineoplastic and immunosuppressive drugs, accidental (unintentional), initial encounter: Secondary | ICD-10-CM | POA: Diagnosis not present

## 2017-07-14 DIAGNOSIS — C8338 Diffuse large B-cell lymphoma, lymph nodes of multiple sites: Secondary | ICD-10-CM | POA: Diagnosis not present

## 2017-07-16 DIAGNOSIS — C8590 Non-Hodgkin lymphoma, unspecified, unspecified site: Secondary | ICD-10-CM | POA: Diagnosis not present

## 2017-07-16 DIAGNOSIS — Z01818 Encounter for other preprocedural examination: Secondary | ICD-10-CM | POA: Diagnosis not present

## 2017-07-16 DIAGNOSIS — J449 Chronic obstructive pulmonary disease, unspecified: Secondary | ICD-10-CM | POA: Diagnosis not present

## 2017-07-16 DIAGNOSIS — Z452 Encounter for adjustment and management of vascular access device: Secondary | ICD-10-CM | POA: Diagnosis not present

## 2017-07-16 DIAGNOSIS — C8338 Diffuse large B-cell lymphoma, lymph nodes of multiple sites: Secondary | ICD-10-CM | POA: Diagnosis not present

## 2017-07-16 DIAGNOSIS — J95811 Postprocedural pneumothorax: Secondary | ICD-10-CM | POA: Diagnosis not present

## 2017-07-16 DIAGNOSIS — J939 Pneumothorax, unspecified: Secondary | ICD-10-CM | POA: Diagnosis not present

## 2017-07-16 DIAGNOSIS — C8330 Diffuse large B-cell lymphoma, unspecified site: Secondary | ICD-10-CM | POA: Diagnosis not present

## 2017-07-16 DIAGNOSIS — R918 Other nonspecific abnormal finding of lung field: Secondary | ICD-10-CM | POA: Diagnosis not present

## 2017-07-17 DIAGNOSIS — C8338 Diffuse large B-cell lymphoma, lymph nodes of multiple sites: Secondary | ICD-10-CM | POA: Diagnosis not present

## 2017-07-17 DIAGNOSIS — Z5111 Encounter for antineoplastic chemotherapy: Secondary | ICD-10-CM | POA: Diagnosis not present

## 2017-07-25 DIAGNOSIS — C8101 Nodular lymphocyte predominant Hodgkin lymphoma, lymph nodes of head, face, and neck: Secondary | ICD-10-CM | POA: Diagnosis not present

## 2017-07-29 DIAGNOSIS — C8338 Diffuse large B-cell lymphoma, lymph nodes of multiple sites: Secondary | ICD-10-CM | POA: Diagnosis not present

## 2017-07-29 DIAGNOSIS — J95811 Postprocedural pneumothorax: Secondary | ICD-10-CM | POA: Diagnosis not present

## 2017-08-05 DIAGNOSIS — I700 Atherosclerosis of aorta: Secondary | ICD-10-CM | POA: Diagnosis not present

## 2017-08-05 DIAGNOSIS — J939 Pneumothorax, unspecified: Secondary | ICD-10-CM | POA: Diagnosis not present

## 2017-08-07 DIAGNOSIS — C8101 Nodular lymphocyte predominant Hodgkin lymphoma, lymph nodes of head, face, and neck: Secondary | ICD-10-CM | POA: Diagnosis not present

## 2017-08-07 DIAGNOSIS — C8330 Diffuse large B-cell lymphoma, unspecified site: Secondary | ICD-10-CM | POA: Diagnosis not present

## 2017-08-07 DIAGNOSIS — Z5111 Encounter for antineoplastic chemotherapy: Secondary | ICD-10-CM | POA: Diagnosis not present

## 2017-08-28 DIAGNOSIS — C8338 Diffuse large B-cell lymphoma, lymph nodes of multiple sites: Secondary | ICD-10-CM | POA: Diagnosis not present

## 2017-08-28 DIAGNOSIS — Z23 Encounter for immunization: Secondary | ICD-10-CM | POA: Diagnosis not present

## 2017-09-05 DIAGNOSIS — C8101 Nodular lymphocyte predominant Hodgkin lymphoma, lymph nodes of head, face, and neck: Secondary | ICD-10-CM | POA: Diagnosis not present

## 2017-09-17 DIAGNOSIS — I700 Atherosclerosis of aorta: Secondary | ICD-10-CM | POA: Diagnosis not present

## 2017-09-17 DIAGNOSIS — R911 Solitary pulmonary nodule: Secondary | ICD-10-CM | POA: Diagnosis not present

## 2017-09-17 DIAGNOSIS — C8590 Non-Hodgkin lymphoma, unspecified, unspecified site: Secondary | ICD-10-CM | POA: Diagnosis not present

## 2017-09-17 DIAGNOSIS — C8101 Nodular lymphocyte predominant Hodgkin lymphoma, lymph nodes of head, face, and neck: Secondary | ICD-10-CM | POA: Diagnosis not present

## 2017-09-18 DIAGNOSIS — Z5189 Encounter for other specified aftercare: Secondary | ICD-10-CM | POA: Diagnosis not present

## 2017-09-18 DIAGNOSIS — C8330 Diffuse large B-cell lymphoma, unspecified site: Secondary | ICD-10-CM | POA: Diagnosis not present

## 2017-09-18 DIAGNOSIS — C8101 Nodular lymphocyte predominant Hodgkin lymphoma, lymph nodes of head, face, and neck: Secondary | ICD-10-CM | POA: Diagnosis not present

## 2017-09-30 DIAGNOSIS — C8101 Nodular lymphocyte predominant Hodgkin lymphoma, lymph nodes of head, face, and neck: Secondary | ICD-10-CM | POA: Diagnosis not present

## 2017-10-09 DIAGNOSIS — C8101 Nodular lymphocyte predominant Hodgkin lymphoma, lymph nodes of head, face, and neck: Secondary | ICD-10-CM | POA: Diagnosis not present

## 2017-10-09 DIAGNOSIS — C8330 Diffuse large B-cell lymphoma, unspecified site: Secondary | ICD-10-CM | POA: Diagnosis not present

## 2017-10-30 DIAGNOSIS — C8108 Nodular lymphocyte predominant Hodgkin lymphoma, lymph nodes of multiple sites: Secondary | ICD-10-CM | POA: Diagnosis not present

## 2017-10-30 DIAGNOSIS — Z5111 Encounter for antineoplastic chemotherapy: Secondary | ICD-10-CM | POA: Diagnosis not present

## 2017-10-30 DIAGNOSIS — C8330 Diffuse large B-cell lymphoma, unspecified site: Secondary | ICD-10-CM | POA: Diagnosis not present

## 2017-11-04 DIAGNOSIS — C8101 Nodular lymphocyte predominant Hodgkin lymphoma, lymph nodes of head, face, and neck: Secondary | ICD-10-CM | POA: Diagnosis not present

## 2017-11-10 DIAGNOSIS — D649 Anemia, unspecified: Secondary | ICD-10-CM | POA: Diagnosis not present

## 2017-11-11 DIAGNOSIS — D649 Anemia, unspecified: Secondary | ICD-10-CM | POA: Diagnosis not present

## 2017-12-03 DIAGNOSIS — C8101 Nodular lymphocyte predominant Hodgkin lymphoma, lymph nodes of head, face, and neck: Secondary | ICD-10-CM | POA: Diagnosis not present

## 2017-12-03 DIAGNOSIS — C8190 Hodgkin lymphoma, unspecified, unspecified site: Secondary | ICD-10-CM | POA: Diagnosis not present

## 2017-12-03 DIAGNOSIS — I700 Atherosclerosis of aorta: Secondary | ICD-10-CM | POA: Diagnosis not present

## 2017-12-04 DIAGNOSIS — R740 Nonspecific elevation of levels of transaminase and lactic acid dehydrogenase [LDH]: Secondary | ICD-10-CM | POA: Diagnosis not present

## 2017-12-04 DIAGNOSIS — Z9221 Personal history of antineoplastic chemotherapy: Secondary | ICD-10-CM | POA: Diagnosis not present

## 2017-12-04 DIAGNOSIS — Z8572 Personal history of non-Hodgkin lymphomas: Secondary | ICD-10-CM | POA: Diagnosis not present

## 2017-12-04 DIAGNOSIS — C8330 Diffuse large B-cell lymphoma, unspecified site: Secondary | ICD-10-CM | POA: Diagnosis not present

## 2017-12-25 DIAGNOSIS — M159 Polyosteoarthritis, unspecified: Secondary | ICD-10-CM | POA: Diagnosis not present

## 2017-12-25 DIAGNOSIS — D510 Vitamin B12 deficiency anemia due to intrinsic factor deficiency: Secondary | ICD-10-CM | POA: Diagnosis not present

## 2017-12-25 DIAGNOSIS — J449 Chronic obstructive pulmonary disease, unspecified: Secondary | ICD-10-CM | POA: Diagnosis not present

## 2018-01-27 DIAGNOSIS — D510 Vitamin B12 deficiency anemia due to intrinsic factor deficiency: Secondary | ICD-10-CM | POA: Diagnosis not present

## 2018-03-03 DIAGNOSIS — D510 Vitamin B12 deficiency anemia due to intrinsic factor deficiency: Secondary | ICD-10-CM | POA: Diagnosis not present

## 2018-04-06 DIAGNOSIS — R740 Nonspecific elevation of levels of transaminase and lactic acid dehydrogenase [LDH]: Secondary | ICD-10-CM | POA: Diagnosis not present

## 2018-04-06 DIAGNOSIS — Z8572 Personal history of non-Hodgkin lymphomas: Secondary | ICD-10-CM | POA: Diagnosis not present

## 2018-04-06 DIAGNOSIS — Z9221 Personal history of antineoplastic chemotherapy: Secondary | ICD-10-CM | POA: Diagnosis not present

## 2018-04-06 DIAGNOSIS — C8330 Diffuse large B-cell lymphoma, unspecified site: Secondary | ICD-10-CM | POA: Diagnosis not present

## 2018-04-28 DIAGNOSIS — E039 Hypothyroidism, unspecified: Secondary | ICD-10-CM | POA: Diagnosis not present

## 2018-04-28 DIAGNOSIS — J449 Chronic obstructive pulmonary disease, unspecified: Secondary | ICD-10-CM | POA: Diagnosis not present

## 2018-04-28 DIAGNOSIS — R634 Abnormal weight loss: Secondary | ICD-10-CM | POA: Diagnosis not present

## 2018-04-28 DIAGNOSIS — Z9181 History of falling: Secondary | ICD-10-CM | POA: Diagnosis not present

## 2018-04-28 DIAGNOSIS — D510 Vitamin B12 deficiency anemia due to intrinsic factor deficiency: Secondary | ICD-10-CM | POA: Diagnosis not present

## 2018-04-28 DIAGNOSIS — E559 Vitamin D deficiency, unspecified: Secondary | ICD-10-CM | POA: Diagnosis not present

## 2018-05-19 DIAGNOSIS — R634 Abnormal weight loss: Secondary | ICD-10-CM | POA: Diagnosis not present

## 2018-05-19 DIAGNOSIS — J449 Chronic obstructive pulmonary disease, unspecified: Secondary | ICD-10-CM | POA: Diagnosis not present

## 2018-05-19 DIAGNOSIS — Z6821 Body mass index (BMI) 21.0-21.9, adult: Secondary | ICD-10-CM | POA: Diagnosis not present

## 2018-06-23 DIAGNOSIS — Z23 Encounter for immunization: Secondary | ICD-10-CM | POA: Diagnosis not present

## 2018-06-23 DIAGNOSIS — R634 Abnormal weight loss: Secondary | ICD-10-CM | POA: Diagnosis not present

## 2018-06-23 DIAGNOSIS — Z6822 Body mass index (BMI) 22.0-22.9, adult: Secondary | ICD-10-CM | POA: Diagnosis not present

## 2018-06-23 DIAGNOSIS — J449 Chronic obstructive pulmonary disease, unspecified: Secondary | ICD-10-CM | POA: Diagnosis not present

## 2018-07-06 DIAGNOSIS — Z23 Encounter for immunization: Secondary | ICD-10-CM | POA: Diagnosis not present

## 2018-07-06 DIAGNOSIS — S79929A Unspecified injury of unspecified thigh, initial encounter: Secondary | ICD-10-CM | POA: Diagnosis not present

## 2018-07-06 DIAGNOSIS — C8338 Diffuse large B-cell lymphoma, lymph nodes of multiple sites: Secondary | ICD-10-CM | POA: Diagnosis present

## 2018-07-06 DIAGNOSIS — C8330 Diffuse large B-cell lymphoma, unspecified site: Secondary | ICD-10-CM

## 2018-07-06 DIAGNOSIS — E876 Hypokalemia: Secondary | ICD-10-CM

## 2018-07-06 DIAGNOSIS — G8918 Other acute postprocedural pain: Secondary | ICD-10-CM | POA: Diagnosis not present

## 2018-07-06 DIAGNOSIS — Z96642 Presence of left artificial hip joint: Secondary | ICD-10-CM | POA: Diagnosis not present

## 2018-07-06 DIAGNOSIS — M810 Age-related osteoporosis without current pathological fracture: Secondary | ICD-10-CM | POA: Diagnosis present

## 2018-07-06 DIAGNOSIS — F329 Major depressive disorder, single episode, unspecified: Secondary | ICD-10-CM | POA: Diagnosis not present

## 2018-07-06 DIAGNOSIS — R0902 Hypoxemia: Secondary | ICD-10-CM | POA: Diagnosis not present

## 2018-07-06 DIAGNOSIS — E039 Hypothyroidism, unspecified: Secondary | ICD-10-CM | POA: Diagnosis present

## 2018-07-06 DIAGNOSIS — M1612 Unilateral primary osteoarthritis, left hip: Secondary | ICD-10-CM | POA: Diagnosis not present

## 2018-07-06 DIAGNOSIS — M6259 Muscle wasting and atrophy, not elsewhere classified, multiple sites: Secondary | ICD-10-CM | POA: Diagnosis not present

## 2018-07-06 DIAGNOSIS — Z4789 Encounter for other orthopedic aftercare: Secondary | ICD-10-CM | POA: Diagnosis not present

## 2018-07-06 DIAGNOSIS — Z7401 Bed confinement status: Secondary | ICD-10-CM | POA: Diagnosis not present

## 2018-07-06 DIAGNOSIS — R52 Pain, unspecified: Secondary | ICD-10-CM | POA: Diagnosis not present

## 2018-07-06 DIAGNOSIS — I959 Hypotension, unspecified: Secondary | ICD-10-CM | POA: Diagnosis present

## 2018-07-06 DIAGNOSIS — I10 Essential (primary) hypertension: Secondary | ICD-10-CM | POA: Diagnosis not present

## 2018-07-06 DIAGNOSIS — M84652A Pathological fracture in other disease, left femur, initial encounter for fracture: Secondary | ICD-10-CM | POA: Diagnosis not present

## 2018-07-06 DIAGNOSIS — J449 Chronic obstructive pulmonary disease, unspecified: Secondary | ICD-10-CM | POA: Diagnosis not present

## 2018-07-06 DIAGNOSIS — S72012D Unspecified intracapsular fracture of left femur, subsequent encounter for closed fracture with routine healing: Secondary | ICD-10-CM | POA: Diagnosis not present

## 2018-07-06 DIAGNOSIS — Z741 Need for assistance with personal care: Secondary | ICD-10-CM | POA: Diagnosis not present

## 2018-07-06 DIAGNOSIS — Z87891 Personal history of nicotine dependence: Secondary | ICD-10-CM | POA: Diagnosis not present

## 2018-07-06 DIAGNOSIS — Z79899 Other long term (current) drug therapy: Secondary | ICD-10-CM | POA: Diagnosis not present

## 2018-07-06 DIAGNOSIS — W19XXXD Unspecified fall, subsequent encounter: Secondary | ICD-10-CM | POA: Diagnosis not present

## 2018-07-06 DIAGNOSIS — S72012A Unspecified intracapsular fracture of left femur, initial encounter for closed fracture: Secondary | ICD-10-CM | POA: Diagnosis present

## 2018-07-06 DIAGNOSIS — M1990 Unspecified osteoarthritis, unspecified site: Secondary | ICD-10-CM | POA: Diagnosis present

## 2018-07-06 DIAGNOSIS — Z9181 History of falling: Secondary | ICD-10-CM | POA: Diagnosis not present

## 2018-07-06 DIAGNOSIS — S72009A Fracture of unspecified part of neck of unspecified femur, initial encounter for closed fracture: Secondary | ICD-10-CM

## 2018-07-06 DIAGNOSIS — S72092A Other fracture of head and neck of left femur, initial encounter for closed fracture: Secondary | ICD-10-CM | POA: Diagnosis not present

## 2018-07-06 DIAGNOSIS — R531 Weakness: Secondary | ICD-10-CM | POA: Diagnosis not present

## 2018-07-06 DIAGNOSIS — Z471 Aftercare following joint replacement surgery: Secondary | ICD-10-CM | POA: Diagnosis not present

## 2018-07-06 DIAGNOSIS — S72002A Fracture of unspecified part of neck of left femur, initial encounter for closed fracture: Secondary | ICD-10-CM | POA: Diagnosis not present

## 2018-07-06 DIAGNOSIS — G2581 Restless legs syndrome: Secondary | ICD-10-CM | POA: Diagnosis not present

## 2018-07-06 DIAGNOSIS — F418 Other specified anxiety disorders: Secondary | ICD-10-CM | POA: Diagnosis not present

## 2018-07-08 DIAGNOSIS — S72012A Unspecified intracapsular fracture of left femur, initial encounter for closed fracture: Secondary | ICD-10-CM

## 2018-07-10 DIAGNOSIS — Z741 Need for assistance with personal care: Secondary | ICD-10-CM | POA: Diagnosis not present

## 2018-07-10 DIAGNOSIS — R262 Difficulty in walking, not elsewhere classified: Secondary | ICD-10-CM | POA: Diagnosis not present

## 2018-07-10 DIAGNOSIS — F418 Other specified anxiety disorders: Secondary | ICD-10-CM | POA: Diagnosis not present

## 2018-07-10 DIAGNOSIS — E039 Hypothyroidism, unspecified: Secondary | ICD-10-CM | POA: Diagnosis not present

## 2018-07-10 DIAGNOSIS — I959 Hypotension, unspecified: Secondary | ICD-10-CM | POA: Diagnosis not present

## 2018-07-10 DIAGNOSIS — M810 Age-related osteoporosis without current pathological fracture: Secondary | ICD-10-CM | POA: Diagnosis not present

## 2018-07-10 DIAGNOSIS — M439 Deforming dorsopathy, unspecified: Secondary | ICD-10-CM | POA: Diagnosis not present

## 2018-07-10 DIAGNOSIS — S79929A Unspecified injury of unspecified thigh, initial encounter: Secondary | ICD-10-CM | POA: Diagnosis not present

## 2018-07-10 DIAGNOSIS — Z96649 Presence of unspecified artificial hip joint: Secondary | ICD-10-CM | POA: Diagnosis not present

## 2018-07-10 DIAGNOSIS — S72009D Fracture of unspecified part of neck of unspecified femur, subsequent encounter for closed fracture with routine healing: Secondary | ICD-10-CM | POA: Diagnosis not present

## 2018-07-10 DIAGNOSIS — C8338 Diffuse large B-cell lymphoma, lymph nodes of multiple sites: Secondary | ICD-10-CM | POA: Diagnosis not present

## 2018-07-10 DIAGNOSIS — F329 Major depressive disorder, single episode, unspecified: Secondary | ICD-10-CM | POA: Diagnosis not present

## 2018-07-10 DIAGNOSIS — D649 Anemia, unspecified: Secondary | ICD-10-CM | POA: Diagnosis not present

## 2018-07-10 DIAGNOSIS — M6259 Muscle wasting and atrophy, not elsewhere classified, multiple sites: Secondary | ICD-10-CM | POA: Diagnosis not present

## 2018-07-10 DIAGNOSIS — M1990 Unspecified osteoarthritis, unspecified site: Secondary | ICD-10-CM | POA: Diagnosis not present

## 2018-07-10 DIAGNOSIS — J449 Chronic obstructive pulmonary disease, unspecified: Secondary | ICD-10-CM | POA: Diagnosis not present

## 2018-07-10 DIAGNOSIS — Z79899 Other long term (current) drug therapy: Secondary | ICD-10-CM | POA: Diagnosis not present

## 2018-07-10 DIAGNOSIS — Z4789 Encounter for other orthopedic aftercare: Secondary | ICD-10-CM | POA: Diagnosis not present

## 2018-07-10 DIAGNOSIS — G2581 Restless legs syndrome: Secondary | ICD-10-CM | POA: Diagnosis not present

## 2018-07-10 DIAGNOSIS — Z7401 Bed confinement status: Secondary | ICD-10-CM | POA: Diagnosis not present

## 2018-07-10 DIAGNOSIS — E876 Hypokalemia: Secondary | ICD-10-CM | POA: Diagnosis not present

## 2018-07-10 DIAGNOSIS — Z9181 History of falling: Secondary | ICD-10-CM | POA: Diagnosis not present

## 2018-07-10 DIAGNOSIS — S72012A Unspecified intracapsular fracture of left femur, initial encounter for closed fracture: Secondary | ICD-10-CM | POA: Diagnosis not present

## 2018-07-10 DIAGNOSIS — S72012D Unspecified intracapsular fracture of left femur, subsequent encounter for closed fracture with routine healing: Secondary | ICD-10-CM | POA: Diagnosis not present

## 2018-07-10 DIAGNOSIS — W19XXXD Unspecified fall, subsequent encounter: Secondary | ICD-10-CM | POA: Diagnosis not present

## 2018-07-10 DIAGNOSIS — R531 Weakness: Secondary | ICD-10-CM | POA: Diagnosis not present

## 2018-07-10 DIAGNOSIS — Z23 Encounter for immunization: Secondary | ICD-10-CM | POA: Diagnosis not present

## 2018-07-10 DIAGNOSIS — C8330 Diffuse large B-cell lymphoma, unspecified site: Secondary | ICD-10-CM | POA: Diagnosis not present

## 2018-07-10 DIAGNOSIS — I10 Essential (primary) hypertension: Secondary | ICD-10-CM | POA: Diagnosis not present

## 2018-07-10 DIAGNOSIS — Z87891 Personal history of nicotine dependence: Secondary | ICD-10-CM | POA: Diagnosis not present

## 2018-07-13 DIAGNOSIS — M439 Deforming dorsopathy, unspecified: Secondary | ICD-10-CM | POA: Diagnosis not present

## 2018-07-13 DIAGNOSIS — S72009D Fracture of unspecified part of neck of unspecified femur, subsequent encounter for closed fracture with routine healing: Secondary | ICD-10-CM | POA: Diagnosis not present

## 2018-07-13 DIAGNOSIS — R262 Difficulty in walking, not elsewhere classified: Secondary | ICD-10-CM | POA: Diagnosis not present

## 2018-07-13 DIAGNOSIS — D649 Anemia, unspecified: Secondary | ICD-10-CM | POA: Diagnosis not present

## 2018-07-29 DIAGNOSIS — Z96649 Presence of unspecified artificial hip joint: Secondary | ICD-10-CM | POA: Diagnosis not present

## 2018-08-03 DIAGNOSIS — C8338 Diffuse large B-cell lymphoma, lymph nodes of multiple sites: Secondary | ICD-10-CM | POA: Diagnosis not present

## 2018-08-03 DIAGNOSIS — M80852D Other osteoporosis with current pathological fracture, left femur, subsequent encounter for fracture with routine healing: Secondary | ICD-10-CM | POA: Diagnosis not present

## 2018-08-04 ENCOUNTER — Other Ambulatory Visit: Payer: Self-pay

## 2018-08-04 DIAGNOSIS — E876 Hypokalemia: Secondary | ICD-10-CM | POA: Diagnosis not present

## 2018-08-04 DIAGNOSIS — J449 Chronic obstructive pulmonary disease, unspecified: Secondary | ICD-10-CM | POA: Diagnosis not present

## 2018-08-04 DIAGNOSIS — K219 Gastro-esophageal reflux disease without esophagitis: Secondary | ICD-10-CM

## 2018-08-04 DIAGNOSIS — Z9889 Other specified postprocedural states: Secondary | ICD-10-CM | POA: Diagnosis not present

## 2018-08-04 DIAGNOSIS — R0600 Dyspnea, unspecified: Secondary | ICD-10-CM | POA: Diagnosis not present

## 2018-08-04 DIAGNOSIS — I110 Hypertensive heart disease with heart failure: Secondary | ICD-10-CM | POA: Diagnosis not present

## 2018-08-04 DIAGNOSIS — C8330 Diffuse large B-cell lymphoma, unspecified site: Secondary | ICD-10-CM | POA: Diagnosis not present

## 2018-08-04 DIAGNOSIS — I361 Nonrheumatic tricuspid (valve) insufficiency: Secondary | ICD-10-CM | POA: Diagnosis not present

## 2018-08-04 DIAGNOSIS — F329 Major depressive disorder, single episode, unspecified: Secondary | ICD-10-CM | POA: Diagnosis not present

## 2018-08-04 DIAGNOSIS — R0602 Shortness of breath: Secondary | ICD-10-CM | POA: Diagnosis not present

## 2018-08-04 DIAGNOSIS — J811 Chronic pulmonary edema: Secondary | ICD-10-CM | POA: Diagnosis not present

## 2018-08-04 DIAGNOSIS — J810 Acute pulmonary edema: Secondary | ICD-10-CM | POA: Diagnosis not present

## 2018-08-04 DIAGNOSIS — I509 Heart failure, unspecified: Secondary | ICD-10-CM | POA: Diagnosis not present

## 2018-08-04 DIAGNOSIS — J9601 Acute respiratory failure with hypoxia: Secondary | ICD-10-CM | POA: Diagnosis not present

## 2018-08-04 DIAGNOSIS — I517 Cardiomegaly: Secondary | ICD-10-CM | POA: Diagnosis not present

## 2018-08-04 DIAGNOSIS — I313 Pericardial effusion (noninflammatory): Secondary | ICD-10-CM | POA: Diagnosis not present

## 2018-08-04 DIAGNOSIS — I340 Nonrheumatic mitral (valve) insufficiency: Secondary | ICD-10-CM | POA: Diagnosis not present

## 2018-08-04 NOTE — Patient Outreach (Signed)
[facility]) Care Management  08/04/2018  Jill Nelson July 01, 1932 [id number]   EMMI- General discharge RED ON EMMI ALERT Day # 4 Date: 08-03-18 Red Alert Reason:  Know who to call about changes in condition? No  Sad/hopeless/anxious/empty? Yes   Outreach attempt: spoke with patient. She states she is unable to talk right now. Advised patient that CM would call another time.   Plan: RN CM will attempt patient again within 4 business days and send letter.  Jone Baseman, RN, MSN [facility] Care Management Care Management Coordinator Direct Line [phone] Toll Free: [phone]  Fax: [phone]

## 2018-08-05 ENCOUNTER — Other Ambulatory Visit: Payer: Self-pay

## 2018-08-05 DIAGNOSIS — J811 Chronic pulmonary edema: Secondary | ICD-10-CM | POA: Diagnosis not present

## 2018-08-05 DIAGNOSIS — Z2821 Immunization not carried out because of patient refusal: Secondary | ICD-10-CM | POA: Diagnosis not present

## 2018-08-05 DIAGNOSIS — I313 Pericardial effusion (noninflammatory): Secondary | ICD-10-CM | POA: Diagnosis not present

## 2018-08-05 DIAGNOSIS — J810 Acute pulmonary edema: Secondary | ICD-10-CM | POA: Diagnosis not present

## 2018-08-05 DIAGNOSIS — F329 Major depressive disorder, single episode, unspecified: Secondary | ICD-10-CM | POA: Diagnosis present

## 2018-08-05 DIAGNOSIS — Z79891 Long term (current) use of opiate analgesic: Secondary | ICD-10-CM | POA: Diagnosis not present

## 2018-08-05 DIAGNOSIS — R0600 Dyspnea, unspecified: Secondary | ICD-10-CM | POA: Diagnosis not present

## 2018-08-05 DIAGNOSIS — K219 Gastro-esophageal reflux disease without esophagitis: Secondary | ICD-10-CM | POA: Diagnosis present

## 2018-08-05 DIAGNOSIS — E039 Hypothyroidism, unspecified: Secondary | ICD-10-CM | POA: Diagnosis present

## 2018-08-05 DIAGNOSIS — I509 Heart failure, unspecified: Secondary | ICD-10-CM | POA: Diagnosis present

## 2018-08-05 DIAGNOSIS — I110 Hypertensive heart disease with heart failure: Secondary | ICD-10-CM | POA: Diagnosis present

## 2018-08-05 DIAGNOSIS — J9601 Acute respiratory failure with hypoxia: Secondary | ICD-10-CM | POA: Diagnosis present

## 2018-08-05 DIAGNOSIS — J449 Chronic obstructive pulmonary disease, unspecified: Secondary | ICD-10-CM | POA: Diagnosis not present

## 2018-08-05 DIAGNOSIS — Z87891 Personal history of nicotine dependence: Secondary | ICD-10-CM | POA: Diagnosis not present

## 2018-08-05 DIAGNOSIS — Z79899 Other long term (current) drug therapy: Secondary | ICD-10-CM | POA: Diagnosis not present

## 2018-08-05 DIAGNOSIS — I5033 Acute on chronic diastolic (congestive) heart failure: Secondary | ICD-10-CM | POA: Diagnosis present

## 2018-08-05 DIAGNOSIS — E876 Hypokalemia: Secondary | ICD-10-CM | POA: Diagnosis present

## 2018-08-05 DIAGNOSIS — Z9889 Other specified postprocedural states: Secondary | ICD-10-CM | POA: Diagnosis not present

## 2018-08-05 DIAGNOSIS — E440 Moderate protein-calorie malnutrition: Secondary | ICD-10-CM | POA: Diagnosis present

## 2018-08-05 DIAGNOSIS — Z681 Body mass index (BMI) 19 or less, adult: Secondary | ICD-10-CM | POA: Diagnosis not present

## 2018-08-05 DIAGNOSIS — C8330 Diffuse large B-cell lymphoma, unspecified site: Secondary | ICD-10-CM | POA: Diagnosis present

## 2018-08-05 DIAGNOSIS — R0602 Shortness of breath: Secondary | ICD-10-CM | POA: Diagnosis not present

## 2018-08-05 NOTE — Patient Outreach (Signed)
[facility]) Care Management  08/05/2018  Chia Mowers August 17, 1932 [id number]   EMMI- General discharge RED ON EMMI ALERT Day # 4 Date:08-03-18 Red Alert Reason: Know who to call about changes in condition? No  Sad/hopeless/anxious/empty? Yes   Outreach attempt: Female answered stating patient not home.  CM contact information given for return call.     Plan: RN CM will attempt patient again within 4 business days.  Jone Baseman, RN, MSN [place] Management Care Management Coordinator Direct Line [phone] Cell [phone] Toll Free: [phone]  Fax: [phone]

## 2018-08-06 ENCOUNTER — Other Ambulatory Visit: Payer: Self-pay

## 2018-08-06 NOTE — Patient Outreach (Signed)
Jill [facility]) Care Management  08/06/2018  Jill Nelson [birth year] [id number]   EMMI-General discharge RED ON EMMI ALERT Day #4 Date:08-03-18 Red Alert Reason: Know who to call about changes in condition? No  Sad/hopeless/anxious/empty? Yes   Outreach attempt: Female answered stating patient not available.  CM contact information given for return call.     Plan: RN CM willwait return call. If no return call will close case.    Jone Baseman, RN, MSN [place] Management Care Management Coordinator Direct Line [phone] Cell [phone] Toll Free: [phone]  Fax: [phone]

## 2018-08-11 DIAGNOSIS — Z1339 Encounter for screening examination for other mental health and behavioral disorders: Secondary | ICD-10-CM | POA: Diagnosis not present

## 2018-08-11 DIAGNOSIS — S72002D Fracture of unspecified part of neck of left femur, subsequent encounter for closed fracture with routine healing: Secondary | ICD-10-CM | POA: Diagnosis not present

## 2018-08-11 DIAGNOSIS — I509 Heart failure, unspecified: Secondary | ICD-10-CM | POA: Diagnosis not present

## 2018-08-11 DIAGNOSIS — I272 Pulmonary hypertension, unspecified: Secondary | ICD-10-CM | POA: Diagnosis not present

## 2018-08-11 DIAGNOSIS — I10 Essential (primary) hypertension: Secondary | ICD-10-CM | POA: Diagnosis not present

## 2018-08-13 DIAGNOSIS — C8338 Diffuse large B-cell lymphoma, lymph nodes of multiple sites: Secondary | ICD-10-CM | POA: Diagnosis not present

## 2018-08-13 DIAGNOSIS — M80852D Other osteoporosis with current pathological fracture, left femur, subsequent encounter for fracture with routine healing: Secondary | ICD-10-CM | POA: Diagnosis not present

## 2018-08-14 DIAGNOSIS — C8338 Diffuse large B-cell lymphoma, lymph nodes of multiple sites: Secondary | ICD-10-CM | POA: Diagnosis not present

## 2018-08-14 DIAGNOSIS — M80852D Other osteoporosis with current pathological fracture, left femur, subsequent encounter for fracture with routine healing: Secondary | ICD-10-CM | POA: Diagnosis not present

## 2018-08-18 ENCOUNTER — Other Ambulatory Visit: Payer: Self-pay

## 2018-08-18 DIAGNOSIS — I509 Heart failure, unspecified: Secondary | ICD-10-CM | POA: Diagnosis not present

## 2018-08-18 DIAGNOSIS — C8330 Diffuse large B-cell lymphoma, unspecified site: Secondary | ICD-10-CM | POA: Diagnosis not present

## 2018-08-18 DIAGNOSIS — Z9221 Personal history of antineoplastic chemotherapy: Secondary | ICD-10-CM | POA: Diagnosis not present

## 2018-08-18 DIAGNOSIS — C8101 Nodular lymphocyte predominant Hodgkin lymphoma, lymph nodes of head, face, and neck: Secondary | ICD-10-CM | POA: Diagnosis not present

## 2018-08-18 NOTE — Patient Outreach (Signed)
[facility]) Care Management  08/18/2018  Jill Nelson Mar 09, 1932 [id number]   Multiple attempts to establish contact with patient without success. No response from letter mailed to patient.   Plan: RN CM will close case at this time.   Jone Baseman, RN, MSN [place] Management Care Management Coordinator Direct Line [phone] Cell [phone] Toll Free: [phone]  Fax: [phone]

## 2018-08-20 ENCOUNTER — Ambulatory Visit (INDEPENDENT_AMBULATORY_CARE_PROVIDER_SITE_OTHER)
Admission: RE | Admit: 2018-08-20 | Discharge: 2018-08-20 | Disposition: A | Discharge status: Home / Self Care | Payer: Medicare Other | Source: Ambulatory Visit | Attending: Internal Medicine | Admitting: Internal Medicine

## 2018-08-20 ENCOUNTER — Encounter: Payer: Self-pay | Admitting: Internal Medicine

## 2018-08-20 ENCOUNTER — Ambulatory Visit (INDEPENDENT_AMBULATORY_CARE_PROVIDER_SITE_OTHER): Payer: Medicare Other | Admitting: Internal Medicine

## 2018-08-20 DIAGNOSIS — I2781 Cor pulmonale (chronic): Secondary | ICD-10-CM | POA: Diagnosis not present

## 2018-08-20 DIAGNOSIS — J449 Chronic obstructive pulmonary disease, unspecified: Secondary | ICD-10-CM | POA: Diagnosis not present

## 2018-08-20 DIAGNOSIS — J9611 Chronic respiratory failure with hypoxia: Secondary | ICD-10-CM | POA: Diagnosis not present

## 2018-08-20 DIAGNOSIS — R0602 Shortness of breath: Secondary | ICD-10-CM | POA: Diagnosis not present

## 2018-08-20 MED ORDER — UMECLIDINIUM-VILANTEROL 62.5-25 MCG/INH IN AEPB: 1.0000 | INHALATION_SPRAY | Freq: Every day | RESPIRATORY_TRACT | 11 refills | Status: DC

## 2018-08-20 MED ORDER — UMECLIDINIUM-VILANTEROL 62.5-25 MCG/INH IN AEPB: 1.0000 | INHALATION_SPRAY | Freq: Every day | RESPIRATORY_TRACT | 0 refills | Status: DC

## 2018-08-20 NOTE — Patient Instructions (Addendum)
Stop advair   Start anoro one click each am  - take two good deep slow drags then do your dental care  - if not better after a week try off the anoro to see what difference if any this makes   Goal for 02 is to keep on 1lpm 24/7 but increase above this level if needed for saturations > 90% as the goal (pulse oximeter at [facility])   Please remember to go to the  x-ray department downstairs in the basement  for your tests - we will call you with the results when they are available.      Please schedule a follow up office visit in 6 weeks, call sooner if needed with pfts on return  - also bring all active meds with you

## 2018-08-20 NOTE — Progress Notes (Signed)
Jill Nelson, female    DOB: 1932-02-25,     MRN: [id number]     Brief patient profile:  23 yowf quit smoking in 1972 and healthy at that time with new doe x 2014 eval Chodri on various inhalers that didn't work and slowly progressed to  summer 2018 = [facility] = couldn't  walk a nl pace on a flat grade s sob but does fine slow and flat   then dx of lymphoma Sept 2018 finished chemo in feb 2019 with worse sob and then abruptly worse mid/oct 2019 dx also with chf > placed on 24h 02 1lpm and referred to pulmonary clinic 08/20/2018 by  Laverna Peace   History of Present Illness  08/20/2018  Pulmonary/ 1st office eval/Jamisen Hawes maint rx = advair 110  Chief Complaint  Patient presents with  . Pulmonary Consult    Referred by Laverna Peace, NP.  Pt c/o SOB for the past several years. She states she was recently hospitalized for CHF and started on o2. She states o2 has helped.   Dyspnea: room to room using walker on 02 1lpm / on advair not sure it helps Cough: no  Sleep: on back flat  2 pillows SABA use: no  No obvious day to day or daytime variability or assoc excess/ purulent sputum or mucus plugs or hemoptysis or cp or chest tightness, subjective wheeze or overt sinus or hb symptoms.   Sleeps as above without nocturnal  or early am exacerbation  of respiratory  c/o's or need for noct saba. Also denies any obvious fluctuation of symptoms with weather or environmental changes or other aggravating or alleviating factors except as outlined above   No unusual exposure hx or h/o childhood pna/ asthma or knowledge of premature birth.  Current Allergies, Complete Past Medical History, Past Surgical History, Family History, and Social History were reviewed in [employer] record.  ROS  The following are not active complaints unless bolded Hoarseness, sore throat, dysphagia, dental problems, itching, sneezing,  nasal congestion or discharge of excess mucus or purulent secretions, ear ache,   fever,  chills, sweats, unintended wt loss or wt gain, classically pleuritic or exertional cp,  orthopnea pnd or arm/hand swelling  or leg swelling, presyncope, palpitations, abdominal pain, anorexia, nausea, vomiting, diarrhea  or change in bowel habits or change in bladder habits, change in stools or change in urine, dysuria, hematuria,  rash, arthralgias, visual complaints, headache, numbness, weakness or ataxia or problems with walking or coordination,  change in mood- says depressed  or  memory.           No past medical history on file.  Outpatient Medications Prior to Visit  Medication Sig Dispense Refill  . amLODipine-atorvastatin (CADUET) 5-10 MG tablet Take 1 tablet by mouth daily.    . Ascorbic Acid (VITAMIN C) 100 MG tablet Take 100 mg by mouth daily.    Marland Kitchen CALCIUM PO Take 1 tablet by mouth daily.    Marland Kitchen ENSURE (ENSURE) Take 1 Can by mouth 2 (two) times daily between meals.    . fenofibrate micronized (LOFIBRA) 200 MG capsule Take 200 mg by mouth daily before breakfast.    . fluticasone-salmeterol (ADVAIR HFA) 115-21 MCG/ACT inhaler Inhale 2 puffs into the lungs 2 (two) times daily.    . furosemide (LASIX) 20 MG tablet Take 20 mg by mouth daily.    Marland Kitchen levothyroxine (SYNTHROID, LEVOTHROID) 50 MCG tablet Take 50 mcg by mouth daily before breakfast.    .  montelukast (SINGULAIR) 10 MG tablet Take 10 mg by mouth at bedtime.    Marland Kitchen omeprazole (PRILOSEC) 40 MG capsule Take 40 mg by mouth daily.    . potassium chloride SA (K-DUR,KLOR-CON) 20 MEQ tablet Take 20 mEq by mouth daily.    . sertraline (ZOLOFT) 100 MG tablet Take 100 mg by mouth daily.    . traMADol (ULTRAM) 50 MG tablet Take 50 mg by mouth every 6 (six) hours as needed.    Alveda Reasons 10 MG TABS tablet Take 1 tablet by mouth daily.  0         Objective:     BP 120/70 (BP Location: Left Arm, Cuff Size: Normal)   Pulse (!) 105   Ht 5\' 3"  (1.6 m)   Wt 104 lb (47.2 kg)   SpO2 95%   BMI 18.42 kg/m   SpO2: 95 % O2 Type: Continuous  O2 O2 Flow Rate (L/min): 2 L/min  W/c bound chronically ill elderly wf nad   HEENT: nl dentition, turbinates bilaterally, and oropharynx. Nl external ear canals without cough reflex   NECK :  without JVD/Nodes/TM/ nl carotid upstrokes bilaterally   LUNGS: no acc muscle use,  Nl contour chest with slt decreased bs  bilaterally without cough on insp or exp maneuvers   CV:  RRR  no s3 or murmur or increase in P2, and no edema   ABD:  soft and nontender with nl inspiratory excursion in the supine position. No bruits or organomegaly appreciated, bowel sounds nl  MS:  Nl gait/ ext warm with classic djd hands, calf tenderness, cyanosis or clubbing No obvious joint restrictions   SKIN: warm and dry without lesions    NEURO:  alert, approp, nl sensorium with  no motor or cerebellar deficits apparent.      I personally reviewed images and agree with radiology impression as follows:  Chest CTa 08/04/18  No PE Interstitial pulmonary edema with bilateral effusions  Small/ mod pericardial effusion Mod HH   CXR PA and Lateral:   08/20/2018 :    I personally reviewed images - impression as follows:    mod t kyphosis / cm s chf      Assessment   COPD GOLD 0  Spirometry 08/20/2018  FEV1 1.2 (71%)  Ratio 82 s curvature p advair in am  - 08/20/2018  After extensive coaching inhaler device,  effectiveness =    90% with elipta > try change to anoro x one week and if no change then d/c      When respiratory symptoms begin or become refractory well after a patient reports complete smoking cessation,  Especially when this wasn't the case while they were smoking, a red flag is raised based on the work of Dr Kris Mouton which states:  if you quit smoking when your best day FEV1 is still well preserved it is highly unlikely you will progress to severe disease.  That is to say, once the smoking stops,  the symptoms should not suddenly erupt or markedly worsen.  If so, the differential diagnosis  should include  obesity/deconditioning,  LPR/Reflux/Aspiration syndromes,  occult CHF, or  especially side effect of medications commonly used in this population (chemo would count though don't know what she took at this point but may have been an alkylating agent for lymphoma)    I strongly doubt she has much copd limiting her based on this concept and present pfts which are more restrictive than obstructive (going along with her kyphosis  on cxr) so rec  >>>>  Change to anoro x one week then try off all inhalers and see what if any difference this makes.      Cor pulmonale (chronic) (HCC) Echo 08/04/18  LVEF 50% with elevated pressure / septal flattening / PAS 73  Small/ mod pericardial effusion  Most likely this is WHO III with element of elevated L ht pressures and underlying chronic hypoxemia from kyphosis/ mild copd / chemo exp so rec  >>> goal is to keep sats > 90% at all times / no role for [facility] drugs here/ confirmed with Dr Lake Bells    Chronic respiratory failure with hypoxia ([place]) Placed on 15 Jul 2018 = 1lpm 24/7    >>> Adequate control on present rx, reviewed in detail with pt > no change in rx needed  - may need to titrate 02 up once/ if more mobile / reviewed with pt and son      Total time devoted to counseling  > 50 % of initial 60 min office visit:  Review extensive  case with pt/son discussion of options/alternatives/ personally creating written customized instructions  in presence of pt  then going over those specific  Instructions directly with the pt including how to use all of the meds but in particular covering each new medication in detail and the difference between the maintenance= "automatic" meds and the prns using an action plan format for the latter (If this problem/symptom => do that organization reading Left to right).  Please see AVS from this visit for a full list of these instructions which I personally wrote for this pt and  are unique to this visit.   See  device teaching which extended face to face time for this visit      Christinia Gully, MD 08/20/2018

## 2018-08-21 ENCOUNTER — Encounter: Payer: Self-pay | Admitting: Internal Medicine

## 2018-08-21 DIAGNOSIS — J9611 Chronic respiratory failure with hypoxia: Secondary | ICD-10-CM | POA: Insufficient documentation

## 2018-08-21 NOTE — Assessment & Plan Note (Signed)
Spirometry 08/20/2018  FEV1 1.2 (71%)  Ratio 82 s curvature p advair in am  - 08/20/2018  After extensive coaching inhaler device,  effectiveness =    90% with elipta > try change to anoro x one week and if no change then d/c      When respiratory symptoms begin or become refractory well after a patient reports complete smoking cessation,  Especially when this wasn't the case while they were smoking, a red flag is raised based on the work of Dr Kris Mouton which states:  if you quit smoking when your best day FEV1 is still well preserved it is highly unlikely you will progress to severe disease.  That is to say, once the smoking stops,  the symptoms should not suddenly erupt or markedly worsen.  If so, the differential diagnosis should include  obesity/deconditioning,  LPR/Reflux/Aspiration syndromes,  occult CHF, or  especially side effect of medications commonly used in this population (chemo would count though don't know what she took at this point but may have been an alkylating agent for lymphoma)    I strongly doubt she has much copd limiting her based on this concept and present pfts which are more restrictive than obstructive (going along with her kyphosis on cxr) so rec  >>>>  Change to anoro x one week then try off all inhalers and see what if any difference this makes.

## 2018-08-21 NOTE — Assessment & Plan Note (Addendum)
Placed on 15 Jul 2018 = 1lpm 24/7    >>> Adequate control on present rx, reviewed in detail with pt > no change in rx needed  - may need to titrate 02 up once/ if more mobile / reviewed with pt and son      Total time devoted to counseling  > 50 % of initial 60 min office visit:  Review extensive  case with pt/son discussion of options/alternatives/ personally creating written customized instructions  in presence of pt  then going over those specific  Instructions directly with the pt including how to use all of the meds but in particular covering each new medication in detail and the difference between the maintenance= "automatic" meds and the prns using an action plan format for the latter (If this problem/symptom => do that organization reading Left to right).  Please see AVS from this visit for a full list of these instructions which I personally wrote for this pt and  are unique to this visit.   See device teaching which extended face to face time for this visit

## 2018-08-21 NOTE — Progress Notes (Signed)
Spoke with pt and notified of results per Dr. Wert. Pt verbalized understanding and denied any questions. 

## 2018-08-21 NOTE — Assessment & Plan Note (Addendum)
Echo 08/04/18  LVEF 50% with elevated pressure / septal flattening / PAS 73  Small/ mod pericardial effusion  Most likely this is WHO III with element of elevated L ht pressures and underlying chronic hypoxemia from kyphosis/ mild copd / chemo exp so rec  >>> goal is to keep sats > 90% at all times / no role for [facility] drugs here/ confirmed with Dr Lake Bells

## 2018-08-24 DIAGNOSIS — M80852D Other osteoporosis with current pathological fracture, left femur, subsequent encounter for fracture with routine healing: Secondary | ICD-10-CM | POA: Diagnosis not present

## 2018-08-24 DIAGNOSIS — C8338 Diffuse large B-cell lymphoma, lymph nodes of multiple sites: Secondary | ICD-10-CM | POA: Diagnosis not present

## 2018-08-25 DIAGNOSIS — Z96649 Presence of unspecified artificial hip joint: Secondary | ICD-10-CM | POA: Diagnosis not present

## 2018-09-03 DIAGNOSIS — C8338 Diffuse large B-cell lymphoma, lymph nodes of multiple sites: Secondary | ICD-10-CM | POA: Diagnosis not present

## 2018-09-03 DIAGNOSIS — M80852D Other osteoporosis with current pathological fracture, left femur, subsequent encounter for fracture with routine healing: Secondary | ICD-10-CM | POA: Diagnosis not present

## 2018-09-14 DIAGNOSIS — J449 Chronic obstructive pulmonary disease, unspecified: Secondary | ICD-10-CM | POA: Diagnosis not present

## 2018-09-14 DIAGNOSIS — R634 Abnormal weight loss: Secondary | ICD-10-CM | POA: Diagnosis not present

## 2018-10-01 ENCOUNTER — Other Ambulatory Visit: Payer: Self-pay | Admitting: Internal Medicine

## 2018-10-01 DIAGNOSIS — J449 Chronic obstructive pulmonary disease, unspecified: Secondary | ICD-10-CM

## 2018-10-02 ENCOUNTER — Ambulatory Visit (INDEPENDENT_AMBULATORY_CARE_PROVIDER_SITE_OTHER): Payer: Medicare Other | Admitting: Internal Medicine

## 2018-10-02 ENCOUNTER — Encounter: Payer: Self-pay | Admitting: Internal Medicine

## 2018-10-02 VITALS — BP 100/58 | HR 115 | Ht 63.0 in | Wt 106.6 lb

## 2018-10-02 DIAGNOSIS — I2781 Cor pulmonale (chronic): Secondary | ICD-10-CM | POA: Diagnosis not present

## 2018-10-02 DIAGNOSIS — J449 Chronic obstructive pulmonary disease, unspecified: Secondary | ICD-10-CM

## 2018-10-02 DIAGNOSIS — J9611 Chronic respiratory failure with hypoxia: Secondary | ICD-10-CM | POA: Diagnosis not present

## 2018-10-02 LAB — PULMONARY FUNCTION TEST
FEF 25-75 Post: 1.32 L/sec
FEF 25-75 Pre: 1.51 L/sec
FEF2575-%CHANGE-POST: -12 %
FEF2575-%PRED-POST: 115 %
FEF2575-%Pred-Pre: 131 %
FEV1-%CHANGE-POST: 0 %
FEV1-%PRED-POST: 77 %
FEV1-%Pred-Pre: 76 %
FEV1-POST: 1.31 L
FEV1-Pre: 1.31 L
FEV1FVC-%Change-Post: -4 %
FEV1FVC-%PRED-PRE: 114 %
FEV6-%Change-Post: 5 %
FEV6-%Pred-Post: 76 %
FEV6-%Pred-Pre: 71 %
FEV6-PRE: 1.56 L
FEV6-Post: 1.65 L
FEV6FVC-%PRED-POST: 106 %
FEV6FVC-%PRED-PRE: 106 %
FVC-%Change-Post: 5 %
FVC-%PRED-POST: 71 %
FVC-%PRED-PRE: 67 %
FVC-POST: 1.65 L
FVC-PRE: 1.56 L
PRE FEV6/FVC RATIO: 100 %
Post FEV1/FVC ratio: 80 %
Post FEV6/FVC ratio: 100 %
Pre FEV1/FVC ratio: 84 %
RV % pred: 109 %
RV: 2.64 L
TLC % pred: 98 %
TLC: 4.82 L

## 2018-10-02 NOTE — Progress Notes (Signed)
Patient tried to complete full PFT today. Pt was unable to complete DLCO, she was unable to take a deep breath. She asked to stop that part of the test.

## 2018-10-02 NOTE — Patient Instructions (Addendum)
Goal for 02 is to keep  saturations > 90%  So try using 4 lpm with activity to see if this prevents a drop in sats    Pulmonary follow up is as needed because you do not have significant copd

## 2018-10-02 NOTE — Progress Notes (Signed)
Jill Nelson, female    DOB: Jun 18, 1932,     MRN: [id number]     Brief patient profile:  7 yowf quit smoking in 1972 and healthy at that time with new doe x 2014 eval Jill Nelson on various inhalers that didn't work and slowly progressed to  summer 2018 = Jill Nelson = couldn't  walk a nl pace on a flat grade s sob but does fine slow and flat   then dx of lymphoma Sept 2018 finished chemo in feb 2019 with worse sob and then abruptly worse mid/oct 2019 dx also with chf > placed on 24h 02 1lpm and referred to pulmonary clinic 08/20/2018 by  Jill Nelson   History of Present Illness  08/20/2018  Pulmonary/ 1st office eval/Jill Nelson maint rx = advair 110  Chief Complaint  Patient presents with  . Pulmonary Consult    Referred by Jill Peace, NP.  Pt c/o SOB for the past several years. She states she was recently hospitalized for CHF and started on o2. She states o2 has helped.   Dyspnea: room to room using walker on 02 1lpm / on advair not sure it helps Cough: no  Sleep: on back flat  2 pillows SABA use: none rec Stop advair  Start anoro one click each am  - take two good deep slow drags then do your dental care  - if not better after a week try off the anoro to see what difference if any this makes  Goal for 02 is to keep on 1lpm 24/7 but increase above this level if needed for saturations > 90% as the goal (pulse oximeter at Jill Nelson) Please schedule a follow up office visit in 6 weeks, call sooner if needed with pfts on return  - also bring all active meds with you    10/02/2018  f/u ov/Jill Nelson re: GOLD 0 copd no better doe with anoro  Chief Complaint  Patient presents with  . Follow-up    PFT's done today. She stopped anoro. She does not have a rescue inhaler. She feels her breathing has improved.    Dyspnea:  Room to room doe even on 02 but doesn't check sats or titrate up  Cough: assoc with pnd Sleeping: flat / 2 pillows  SABA use: none 02: 2lpm  24/7    No obvious day to day or daytime variability  or assoc excess/ purulent sputum or mucus plugs or hemoptysis or cp or chest tightness, subjective wheeze or overt sinus or hb symptoms.   Sleeping ok as above without nocturnal  or early am exacerbation  of respiratory  c/o's or need for noct saba. Also denies any obvious fluctuation of symptoms with weather or environmental changes or other aggravating or alleviating factors except as outlined above   No unusual exposure hx or h/o childhood pna/ asthma or knowledge of premature birth.  Current Allergies, Complete Past Medical History, Past Surgical History, Family History, and Social History were reviewed in Jill Nelson.  ROS  The following are not active complaints unless bolded Hoarseness, sore throat, dysphagia, dental problems, itching, sneezing,  nasal congestion or discharge of excess mucus or purulent secretions, ear ache,   fever, chills, sweats, unintended wt loss or wt gain, classically pleuritic or exertional cp,  orthopnea pnd or arm/hand swelling  or leg swelling, presyncope, palpitations, abdominal pain, anorexia, nausea, vomiting, diarrhea  or change in bowel habits or change in bladder habits, change in stools or change in urine, dysuria, hematuria,  rash, arthralgias, visual complaints, headache, numbness, weakness /problems with walking or coordination,  change in mood or  memory.        Current Meds  Medication Sig  . amLODipine-atorvastatin (CADUET) 5-10 MG tablet Take 1 tablet by mouth daily.  . Ascorbic Acid (VITAMIN C) 100 MG tablet Take 100 mg by mouth daily.  Marland Kitchen CALCIUM PO Take 1 tablet by mouth daily.  Marland Kitchen ENSURE (ENSURE) Take 1 Can by mouth 2 (two) times daily between meals.  . fenofibrate micronized (LOFIBRA) 200 MG capsule Take 200 mg by mouth daily before breakfast.  . furosemide (LASIX) 20 MG tablet Take 20 mg by mouth daily.  Marland Kitchen levothyroxine (SYNTHROID, LEVOTHROID) 50 MCG tablet Take 50 mcg by mouth daily before breakfast.  .  montelukast (SINGULAIR) 10 MG tablet Take 10 mg by mouth at bedtime.  Marland Kitchen omeprazole (PRILOSEC) 40 MG capsule Take 40 mg by mouth daily.  . potassium chloride SA (K-DUR,KLOR-CON) 20 MEQ tablet Take 20 mEq by mouth daily.  . sertraline (ZOLOFT) 100 MG tablet Take 100 mg by mouth daily.  . traMADol (ULTRAM) 50 MG tablet Take 50 mg by mouth every 6 (six) hours as needed.  Alveda Reasons 10 MG TABS tablet Take 1 tablet by mouth daily.                Objective:    Wt Readings from Last 3 Encounters:  10/02/18 106 lb 9.6 oz (48.4 kg)  08/20/18 104 lb (47.2 kg)     Vital signs reviewed - Note on arrival 02 sats  93% on 2lpm cont     W/c bound elderly wf nad with occ throat clearing    HEENT: nl dentition / oropharynx. Nl external ear canals without cough reflex -  Mild bilateral non-specific turbinate edema     NECK :  without JVD/Nodes/TM/ nl carotid upstrokes bilaterally   LUNGS: no acc muscle use,  Mild barrel  contour chest wall with bilateral  Distant bs s audible wheeze and  without cough on insp or exp maneuver and mild  Hyperresonant  to  percussion bilaterally     CV:  RRR  no s3 or murmur or increase in P2, and no edema   ABD:  soft and nontender with pos end insp Hoover's  in the supine position. No bruits or organomegaly appreciated, bowel sounds nl  MS:   N  ext warm with classic severe djd changes both hands, calf tenderness, cyanosis or clubbing No obvious joint restrictions   SKIN: warm and dry without lesions    NEURO:  alert, approp, nl sensorium with  no motor or cerebellar deficits apparent.            Assessment

## 2018-10-03 ENCOUNTER — Encounter: Payer: Self-pay | Admitting: Internal Medicine

## 2018-10-03 NOTE — Assessment & Plan Note (Addendum)
Placed on 15 Jul 2018  - as of 10/02/2018  rec 2lpm at rest, increase to 4lpm with activity and titrate if needed to keep > 90%   She may need a [facility] eval for adequate 02 with exertion but defer that to PCP as the original order was made thru that office  If 02 needs increase will need HRCT chest looking for ILD related to chemo exposure and ESR to consider trial of high dose steroids > instructed to return if that is the case, otherwise f/u can be prn

## 2018-10-03 NOTE — Assessment & Plan Note (Signed)
Echo 08/04/18  LVEF 50% with elevated pressure / septal flattening / PAS 73  Small/ mod pericardial effusion  C/w WHO III rx is to treat with 02 and appears clinically improved since that was started   I had an extended discussion with the patient and son  reviewing all relevant studies completed to date and  lasting 15 to 20 minutes of a 25 minute visit    Each maintenance medication was reviewed in detail including most importantly the difference between maintenance and prns and under what circumstances the prns are to be triggered using an action plan format that is not reflected in the computer generated alphabetically organized AVS.     Please see AVS for specific instructions unique to this visit that I personally wrote and verbalized to the the pt in detail and then reviewed with pt  by my nurse highlighting any  changes in therapy recommended at today's visit to their plan of care.

## 2018-10-03 NOTE — Assessment & Plan Note (Signed)
Spirometry 08/20/2018  FEV1 1.2 (71%)  Ratio 82 s curvature p advair in am  - 08/20/2018  After extensive coaching inhaler device,  effectiveness =    90% with elipta > try change to anoro x one week and if no change then d/c   - PFT's  10/02/2018  FEV1 1.31 (77 % ) ratio 80  p 0 % improvement from saba p nothing prior to study with DLCO unobtainable, min curvature and FEV1/VC = 60%   So she doe not meet the classic criteria for copd but likely does have the equivalent of GOLD II  Level but no better for anoro so ok to leave off and just rx by providing adequate 02 for what amounts at this point as a "fixed dz"

## 2018-11-10 DIAGNOSIS — J449 Chronic obstructive pulmonary disease, unspecified: Secondary | ICD-10-CM | POA: Diagnosis not present

## 2018-11-10 DIAGNOSIS — F321 Major depressive disorder, single episode, moderate: Secondary | ICD-10-CM | POA: Diagnosis not present

## 2018-11-10 DIAGNOSIS — R634 Abnormal weight loss: Secondary | ICD-10-CM | POA: Diagnosis not present

## 2018-11-10 DIAGNOSIS — M159 Polyosteoarthritis, unspecified: Secondary | ICD-10-CM | POA: Diagnosis not present

## 2018-12-17 DIAGNOSIS — C8330 Diffuse large B-cell lymphoma, unspecified site: Secondary | ICD-10-CM | POA: Diagnosis not present

## 2018-12-17 DIAGNOSIS — C8101 Nodular lymphocyte predominant Hodgkin lymphoma, lymph nodes of head, face, and neck: Secondary | ICD-10-CM | POA: Diagnosis not present

## 2018-12-22 DIAGNOSIS — D509 Iron deficiency anemia, unspecified: Secondary | ICD-10-CM | POA: Diagnosis not present

## 2018-12-22 DIAGNOSIS — C8101 Nodular lymphocyte predominant Hodgkin lymphoma, lymph nodes of head, face, and neck: Secondary | ICD-10-CM | POA: Diagnosis not present

## 2018-12-29 DIAGNOSIS — C8101 Nodular lymphocyte predominant Hodgkin lymphoma, lymph nodes of head, face, and neck: Secondary | ICD-10-CM | POA: Diagnosis not present

## 2018-12-29 DIAGNOSIS — D509 Iron deficiency anemia, unspecified: Secondary | ICD-10-CM | POA: Diagnosis not present

## 2019-01-19 DIAGNOSIS — I10 Essential (primary) hypertension: Secondary | ICD-10-CM | POA: Diagnosis not present

## 2019-01-19 DIAGNOSIS — R634 Abnormal weight loss: Secondary | ICD-10-CM | POA: Diagnosis not present

## 2019-01-19 DIAGNOSIS — J449 Chronic obstructive pulmonary disease, unspecified: Secondary | ICD-10-CM | POA: Diagnosis not present

## 2019-03-30 DIAGNOSIS — I10 Essential (primary) hypertension: Secondary | ICD-10-CM | POA: Diagnosis not present

## 2019-03-30 DIAGNOSIS — J449 Chronic obstructive pulmonary disease, unspecified: Secondary | ICD-10-CM | POA: Diagnosis not present

## 2019-03-30 DIAGNOSIS — R634 Abnormal weight loss: Secondary | ICD-10-CM | POA: Diagnosis not present

## 2019-03-30 DIAGNOSIS — I5032 Chronic diastolic (congestive) heart failure: Secondary | ICD-10-CM | POA: Diagnosis not present

## 2019-06-01 DIAGNOSIS — I10 Essential (primary) hypertension: Secondary | ICD-10-CM | POA: Diagnosis not present

## 2019-06-01 DIAGNOSIS — J449 Chronic obstructive pulmonary disease, unspecified: Secondary | ICD-10-CM | POA: Diagnosis not present

## 2019-06-22 DIAGNOSIS — C8330 Diffuse large B-cell lymphoma, unspecified site: Secondary | ICD-10-CM | POA: Diagnosis not present

## 2019-06-22 DIAGNOSIS — D509 Iron deficiency anemia, unspecified: Secondary | ICD-10-CM | POA: Diagnosis not present

## 2019-06-22 DIAGNOSIS — C8101 Nodular lymphocyte predominant Hodgkin lymphoma, lymph nodes of head, face, and neck: Secondary | ICD-10-CM

## 2019-07-29 DIAGNOSIS — Z1231 Encounter for screening mammogram for malignant neoplasm of breast: Secondary | ICD-10-CM | POA: Diagnosis not present

## 2019-07-29 DIAGNOSIS — Z1331 Encounter for screening for depression: Secondary | ICD-10-CM | POA: Diagnosis not present

## 2019-07-29 DIAGNOSIS — Z9181 History of falling: Secondary | ICD-10-CM | POA: Diagnosis not present

## 2019-07-29 DIAGNOSIS — E785 Hyperlipidemia, unspecified: Secondary | ICD-10-CM | POA: Diagnosis not present

## 2019-07-29 DIAGNOSIS — Z0000 Encounter for general adult medical examination without abnormal findings: Secondary | ICD-10-CM | POA: Diagnosis not present

## 2019-08-03 DIAGNOSIS — I10 Essential (primary) hypertension: Secondary | ICD-10-CM | POA: Diagnosis not present

## 2019-08-03 DIAGNOSIS — Z139 Encounter for screening, unspecified: Secondary | ICD-10-CM | POA: Diagnosis not present

## 2019-08-03 DIAGNOSIS — F321 Major depressive disorder, single episode, moderate: Secondary | ICD-10-CM | POA: Diagnosis not present

## 2019-08-03 DIAGNOSIS — I5032 Chronic diastolic (congestive) heart failure: Secondary | ICD-10-CM | POA: Diagnosis not present

## 2019-08-03 DIAGNOSIS — Z23 Encounter for immunization: Secondary | ICD-10-CM | POA: Diagnosis not present

## 2019-12-20 DIAGNOSIS — C8101 Nodular lymphocyte predominant Hodgkin lymphoma, lymph nodes of head, face, and neck: Secondary | ICD-10-CM | POA: Diagnosis not present

## 2019-12-27 DIAGNOSIS — I5032 Chronic diastolic (congestive) heart failure: Secondary | ICD-10-CM | POA: Diagnosis not present

## 2019-12-27 DIAGNOSIS — F321 Major depressive disorder, single episode, moderate: Secondary | ICD-10-CM | POA: Diagnosis not present

## 2019-12-27 DIAGNOSIS — Z6823 Body mass index (BMI) 23.0-23.9, adult: Secondary | ICD-10-CM | POA: Diagnosis not present

## 2019-12-27 DIAGNOSIS — I10 Essential (primary) hypertension: Secondary | ICD-10-CM | POA: Diagnosis not present

## 2020-05-27 IMAGING — DX DG CHEST 2V
2 series · 2 of 2 positions shown · non-contrast
Comparison: None

CLINICAL DATA: Shortness of breath, cor pulmonale, former smoker,
history CHF

EXAM:
CHEST - 2 VIEW

[chest pa]
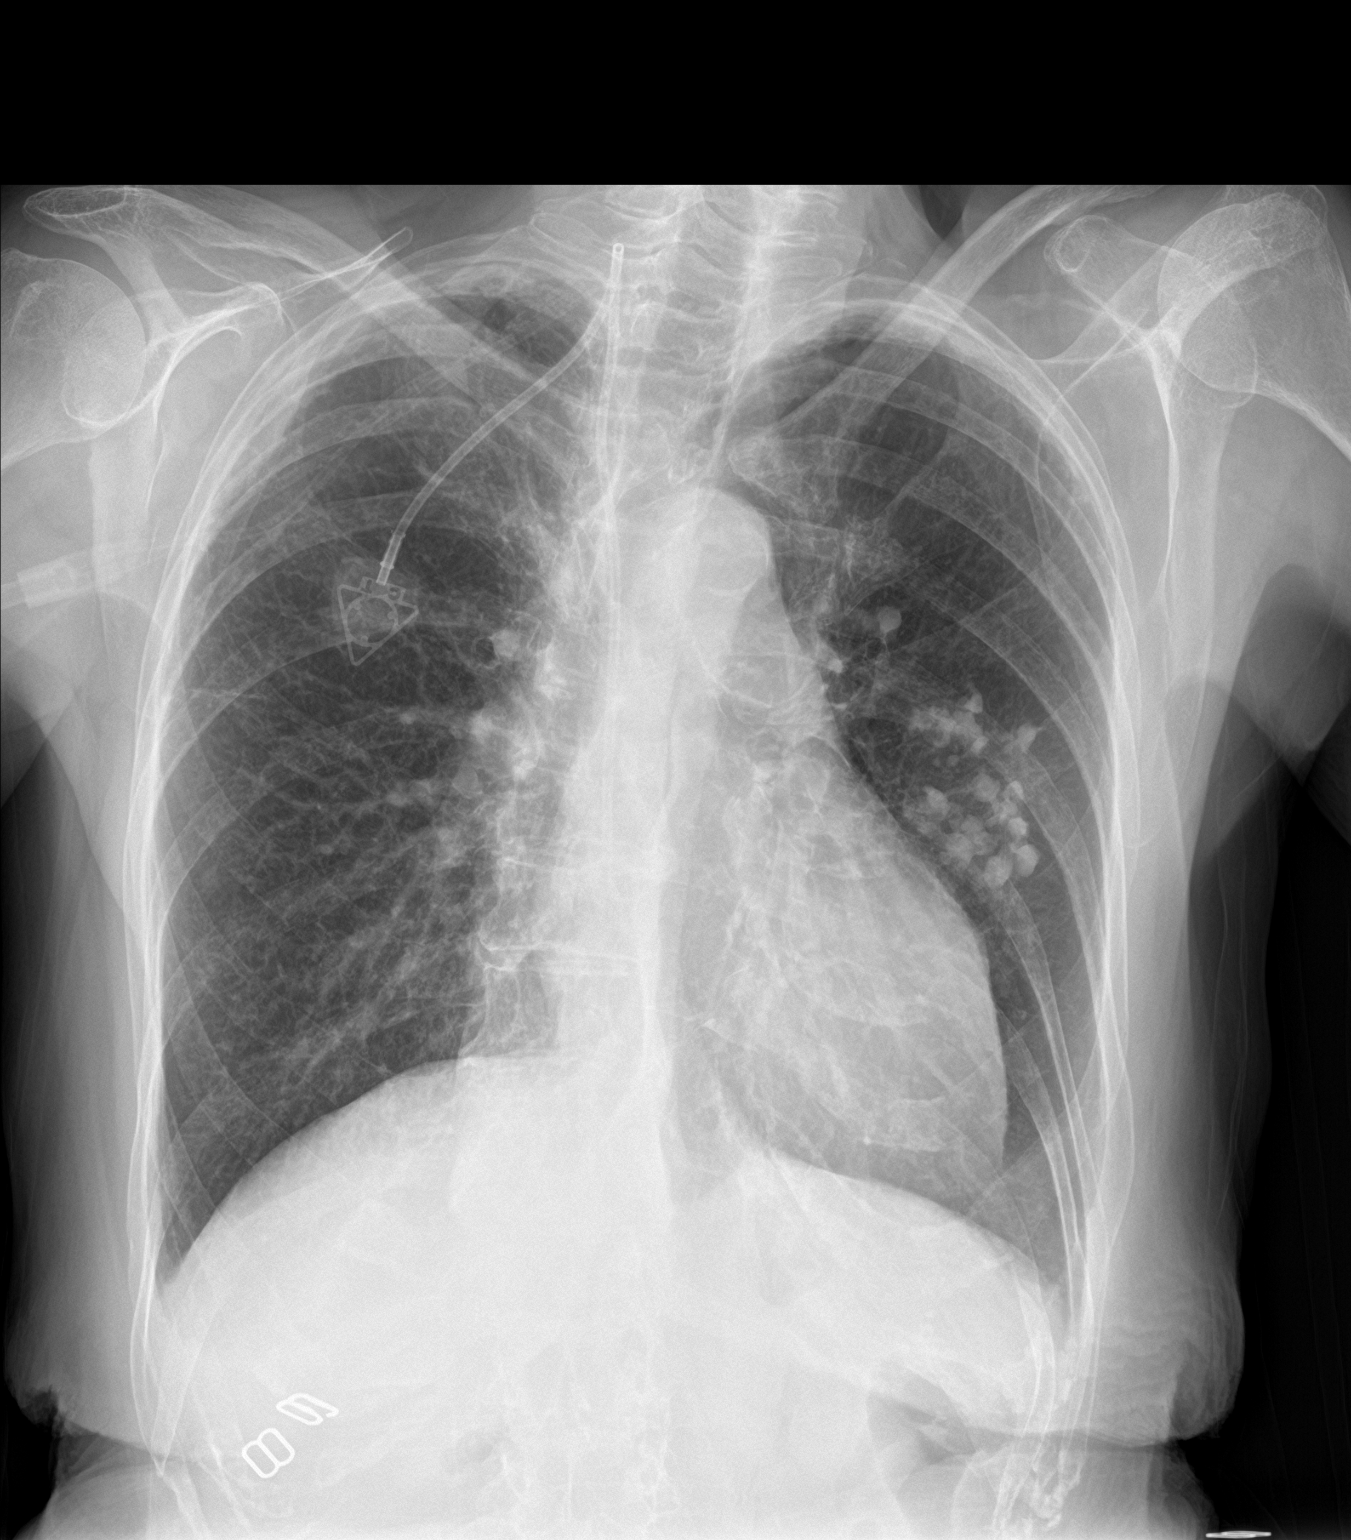

[chest lat]
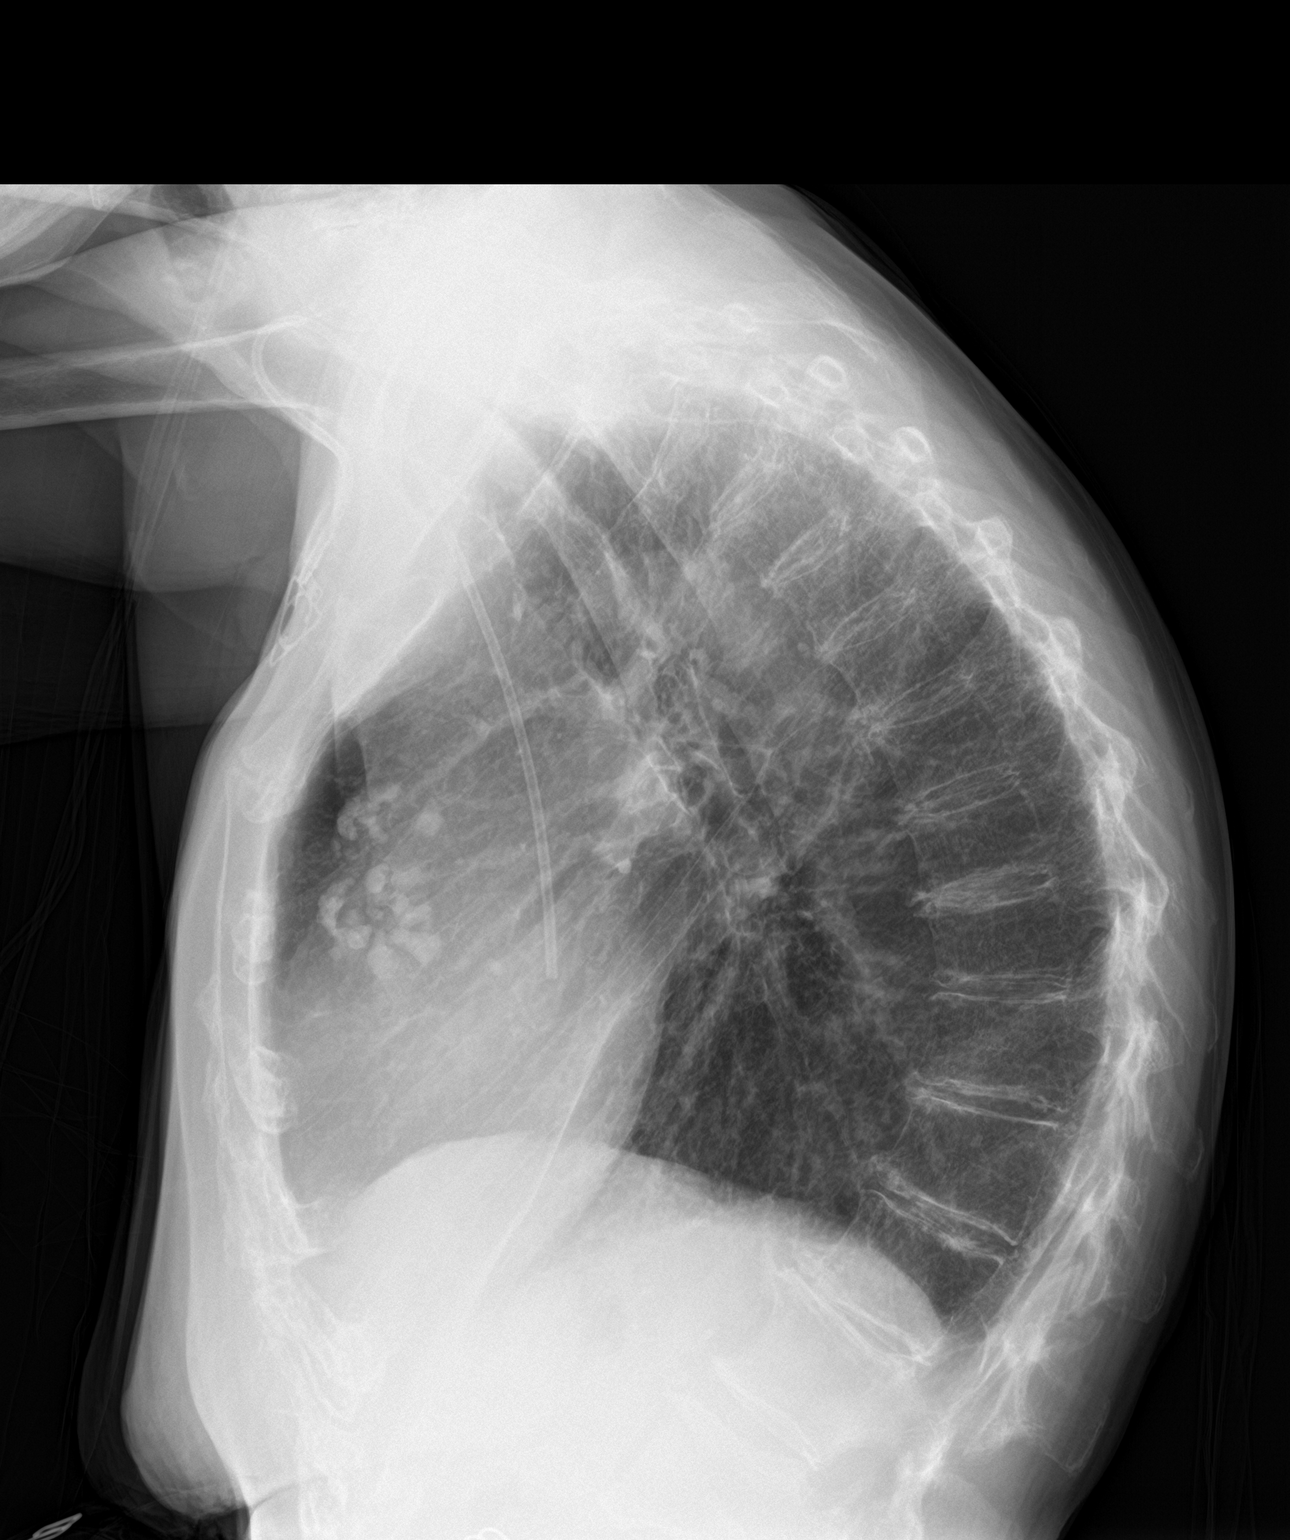

[2 of 2 positions shown; findings below may reference images not displayed]

FINDINGS: RIGHT jugular Port-A-Cath with tip projecting over SVC.

Normal heart size, mediastinal contours, and pulmonary vascularity.

Atherosclerotic calcification aorta.

Emphysematous and bronchitic changes consistent with COPD.

Biapical scarring.

Multiple calcified granulomata in LEFT upper lobe anteriorly.

Suspect small calcified LEFT hilar lymph node.

Remaining lungs clear without acute infiltrate, pleural effusion or
pneumothorax.

Diffuse osseous demineralization.
IMPRESSION: Changes of COPD and old granulomatous disease.

No acute abnormalities.

## 2020-06-06 DIAGNOSIS — I10 Essential (primary) hypertension: Secondary | ICD-10-CM | POA: Diagnosis not present

## 2020-06-06 DIAGNOSIS — Z9981 Dependence on supplemental oxygen: Secondary | ICD-10-CM | POA: Diagnosis not present

## 2020-06-06 DIAGNOSIS — Z6822 Body mass index (BMI) 22.0-22.9, adult: Secondary | ICD-10-CM | POA: Diagnosis not present

## 2020-06-06 DIAGNOSIS — M159 Polyosteoarthritis, unspecified: Secondary | ICD-10-CM | POA: Diagnosis not present

## 2020-06-06 DIAGNOSIS — D509 Iron deficiency anemia, unspecified: Secondary | ICD-10-CM | POA: Diagnosis not present

## 2020-06-06 DIAGNOSIS — E039 Hypothyroidism, unspecified: Secondary | ICD-10-CM | POA: Diagnosis not present

## 2020-06-06 DIAGNOSIS — C8510 Unspecified B-cell lymphoma, unspecified site: Secondary | ICD-10-CM | POA: Diagnosis not present

## 2020-06-06 DIAGNOSIS — I5032 Chronic diastolic (congestive) heart failure: Secondary | ICD-10-CM | POA: Diagnosis not present

## 2020-06-06 DIAGNOSIS — J9611 Chronic respiratory failure with hypoxia: Secondary | ICD-10-CM | POA: Diagnosis not present

## 2020-06-06 DIAGNOSIS — F321 Major depressive disorder, single episode, moderate: Secondary | ICD-10-CM | POA: Diagnosis not present

## 2020-06-06 DIAGNOSIS — J449 Chronic obstructive pulmonary disease, unspecified: Secondary | ICD-10-CM | POA: Diagnosis not present

## 2020-06-22 DIAGNOSIS — Z79899 Other long term (current) drug therapy: Secondary | ICD-10-CM | POA: Diagnosis not present

## 2020-06-22 DIAGNOSIS — C8101 Nodular lymphocyte predominant Hodgkin lymphoma, lymph nodes of head, face, and neck: Secondary | ICD-10-CM | POA: Diagnosis not present

## 2020-08-17 DIAGNOSIS — H52223 Regular astigmatism, bilateral: Secondary | ICD-10-CM | POA: Diagnosis not present

## 2020-08-17 DIAGNOSIS — H353131 Nonexudative age-related macular degeneration, bilateral, early dry stage: Secondary | ICD-10-CM | POA: Diagnosis not present

## 2020-08-17 DIAGNOSIS — H5211 Myopia, right eye: Secondary | ICD-10-CM | POA: Diagnosis not present

## 2020-08-17 DIAGNOSIS — Z9842 Cataract extraction status, left eye: Secondary | ICD-10-CM | POA: Diagnosis not present

## 2020-08-17 DIAGNOSIS — Z961 Presence of intraocular lens: Secondary | ICD-10-CM | POA: Diagnosis not present

## 2020-08-17 DIAGNOSIS — Z9841 Cataract extraction status, right eye: Secondary | ICD-10-CM | POA: Diagnosis not present

## 2020-08-17 DIAGNOSIS — H5202 Hypermetropia, left eye: Secondary | ICD-10-CM | POA: Diagnosis not present

## 2020-08-17 DIAGNOSIS — H524 Presbyopia: Secondary | ICD-10-CM | POA: Diagnosis not present

## 2020-08-24 DIAGNOSIS — Z139 Encounter for screening, unspecified: Secondary | ICD-10-CM | POA: Diagnosis not present

## 2020-08-24 DIAGNOSIS — E785 Hyperlipidemia, unspecified: Secondary | ICD-10-CM | POA: Diagnosis not present

## 2020-08-24 DIAGNOSIS — Z9181 History of falling: Secondary | ICD-10-CM | POA: Diagnosis not present

## 2020-08-24 DIAGNOSIS — Z0000 Encounter for general adult medical examination without abnormal findings: Secondary | ICD-10-CM | POA: Diagnosis not present

## 2020-08-24 DIAGNOSIS — Z1331 Encounter for screening for depression: Secondary | ICD-10-CM | POA: Diagnosis not present

## 2020-10-10 DIAGNOSIS — I10 Essential (primary) hypertension: Secondary | ICD-10-CM | POA: Diagnosis not present

## 2020-10-10 DIAGNOSIS — J449 Chronic obstructive pulmonary disease, unspecified: Secondary | ICD-10-CM | POA: Diagnosis not present

## 2020-10-10 DIAGNOSIS — C8510 Unspecified B-cell lymphoma, unspecified site: Secondary | ICD-10-CM | POA: Diagnosis not present

## 2020-10-10 DIAGNOSIS — J9611 Chronic respiratory failure with hypoxia: Secondary | ICD-10-CM | POA: Diagnosis not present

## 2020-10-10 DIAGNOSIS — M159 Polyosteoarthritis, unspecified: Secondary | ICD-10-CM | POA: Diagnosis not present

## 2020-10-10 DIAGNOSIS — D509 Iron deficiency anemia, unspecified: Secondary | ICD-10-CM | POA: Diagnosis not present

## 2020-10-10 DIAGNOSIS — E039 Hypothyroidism, unspecified: Secondary | ICD-10-CM | POA: Diagnosis not present

## 2020-10-10 DIAGNOSIS — Z9981 Dependence on supplemental oxygen: Secondary | ICD-10-CM | POA: Diagnosis not present

## 2020-10-10 DIAGNOSIS — F321 Major depressive disorder, single episode, moderate: Secondary | ICD-10-CM | POA: Diagnosis not present

## 2020-10-10 DIAGNOSIS — I5032 Chronic diastolic (congestive) heart failure: Secondary | ICD-10-CM | POA: Diagnosis not present

## 2020-12-19 ENCOUNTER — Inpatient Hospital Stay: Payer: Medicare Other | Admitting: Oncology

## 2020-12-19 ENCOUNTER — Inpatient Hospital Stay: Payer: Medicare Other

## 2020-12-24 ENCOUNTER — Other Ambulatory Visit: Payer: Self-pay | Admitting: Oncology

## 2020-12-24 DIAGNOSIS — C8330 Diffuse large B-cell lymphoma, unspecified site: Secondary | ICD-10-CM

## 2020-12-24 NOTE — Progress Notes (Signed)
[facility]  [address] [place],  [place]  [address] [phone]  Clinic Day:  12/25/2020  Referring physician: Lowella Dandy, NP   HISTORY OF PRESENT ILLNESS:  The patient is a 87 y.o. female with stage IVB diffuse large B-cell lymphoma, status post 6 cycles of R-CHOP chemotherapy, which were completed in January 2019.  A PET scan done after the completion of her chemotherapy showed a complete response to her treatment.  The patient comes in today for routine follow up.  With respect to her diffuse large B-cell lymphoma, she denies having any B symptoms or bulky lymphadenopathy which concerns her for disease recurrence.  Of note, the patient also carries the diagnosis of iron deficiency anemia, which was previously corrected with IV Feraheme.  She denies having increased fatigue or any overt forms of blood loss which concern her for progressive anemia.  PHYSICAL EXAM:  Blood pressure (!) 115/56, pulse (!) 101, temperature 98.4 F (36.9 C), resp. rate 20, height 5\' 2"  (1.575 m), weight 111 lb 1.6 oz (50.4 kg), SpO2 99 %. Wt Readings from Last 3 Encounters:  12/25/20 111 lb 1.6 oz (50.4 kg)  10/02/18 106 lb 9.6 oz (48.4 kg)  08/20/18 104 lb (47.2 kg)   Body mass index is 20.32 kg/m. Performance status (ECOG): 2 - Symptomatic, <50% confined to bed Physical Exam Constitutional:      Appearance: Normal appearance. She is not ill-appearing (chronically ill appearance; in a wheelchair and wearing oxygen per nasal canula ).  HENT:     Mouth/Throat:     Mouth: Mucous membranes are moist.     Pharynx: Oropharynx is clear. No oropharyngeal exudate or posterior oropharyngeal erythema.  Cardiovascular:     Rate and Rhythm: Normal rate and regular rhythm.     Heart sounds: No murmur heard. No friction rub. No gallop.   Pulmonary:     Effort: Pulmonary effort is normal. No respiratory distress.     Breath sounds: Normal breath sounds. No wheezing, rhonchi or  rales.  Chest:  Breasts:     Right: No axillary adenopathy or supraclavicular adenopathy.     Left: No axillary adenopathy or supraclavicular adenopathy.    Abdominal:     General: Bowel sounds are normal. There is no distension.     Palpations: Abdomen is soft. There is no mass.     Tenderness: There is no abdominal tenderness.  Musculoskeletal:        General: No swelling.     Right lower leg: No edema.     Left lower leg: No edema.  Lymphadenopathy:     Cervical: No cervical adenopathy.     Upper Body:     Right upper body: No supraclavicular or axillary adenopathy.     Left upper body: No supraclavicular or axillary adenopathy.     Lower Body: No right inguinal adenopathy. No left inguinal adenopathy.  Skin:    General: Skin is warm.     Coloration: Skin is not jaundiced.     Findings: No lesion or rash.  Neurological:     General: No focal deficit present.     Mental Status: She is alert and oriented to person, place, and time. Mental status is at baseline.     Cranial Nerves: Cranial nerves are intact.  Psychiatric:        Mood and Affect: Mood normal.        Behavior: Behavior normal.  Thought Content: Thought content normal.     LABS:   CBC Latest Ref Rng & Units 12/25/2020  WBC - 9.9  Hemoglobin 12.0 - 16.0 12.3  Hematocrit 36 - 46 37  Platelets 150 - 399 222   CMP Latest Ref Rng & Units 12/25/2020  BUN 4 - 21 19  Creatinine 0.5 - 1.1 0.7  Sodium 137 - 147 137  Potassium 3.4 - 5.3 3.6  Chloride 99 - 108 101  CO2 13 - 22 28(A)  Calcium 8.7 - 10.7 8.9  Alkaline Phos 25 - 125 127(A)  AST 13 - 35 33  ALT 7 - 35 17    Ref. Range 12/25/2020 13:54  LDH Latest Ref Range: 98 - 192 U/L 148  Iron Latest Ref Range: 28 - 170 ug/dL 65  UIBC Latest Units: ug/dL 343  TIBC Latest Ref Range: 250 - 450 ug/dL 408  Saturation Ratios Latest Ref Range: 10.4 - 31.8 % 16  Ferritin Latest Ref Range: 11 - 307 ng/mL 18   ASSESSMENT & PLAN:  Assessment/Plan:  An 87  y.o. female with stage IVB diffuse large B-cell lymphoma, status post 6 cycles of R-CHOP chemotherapy, which were completed in January 2019.  Based upon her labs and physical exam today, the patient remains disease-free.  With respect to her iron deficiency anemia, her hematologic parameters are all normal, but her iron levels appear to be trending downwards.  They will continue to be followed over time.  Overall, the patient appears to be doing okay.  I will see her back in another 6 months for repeat clinical assessment.  The patient understands all the plans discussed today and is in agreement with them.    Mykaylah Ballman Macarthur Critchley, MD

## 2020-12-25 ENCOUNTER — Other Ambulatory Visit: Payer: Self-pay

## 2020-12-25 ENCOUNTER — Inpatient Hospital Stay: Payer: Medicare Other | Attending: Oncology

## 2020-12-25 ENCOUNTER — Inpatient Hospital Stay (INDEPENDENT_AMBULATORY_CARE_PROVIDER_SITE_OTHER): Payer: Medicare Other | Admitting: Oncology

## 2020-12-25 ENCOUNTER — Other Ambulatory Visit: Payer: Self-pay | Admitting: Oncology

## 2020-12-25 ENCOUNTER — Other Ambulatory Visit: Payer: Self-pay | Admitting: Hematology and Oncology

## 2020-12-25 VITALS — BP 115/56 | HR 101 | Temp 98.4°F | Resp 20 | Ht 62.0 in | Wt 111.1 lb

## 2020-12-25 DIAGNOSIS — C8101 Nodular lymphocyte predominant Hodgkin lymphoma, lymph nodes of head, face, and neck: Secondary | ICD-10-CM | POA: Insufficient documentation

## 2020-12-25 DIAGNOSIS — Z9221 Personal history of antineoplastic chemotherapy: Secondary | ICD-10-CM | POA: Diagnosis not present

## 2020-12-25 DIAGNOSIS — D649 Anemia, unspecified: Secondary | ICD-10-CM | POA: Diagnosis not present

## 2020-12-25 DIAGNOSIS — C8338 Diffuse large B-cell lymphoma, lymph nodes of multiple sites: Secondary | ICD-10-CM

## 2020-12-25 DIAGNOSIS — D508 Other iron deficiency anemias: Secondary | ICD-10-CM

## 2020-12-25 DIAGNOSIS — C8330 Diffuse large B-cell lymphoma, unspecified site: Secondary | ICD-10-CM | POA: Insufficient documentation

## 2020-12-25 DIAGNOSIS — D509 Iron deficiency anemia, unspecified: Secondary | ICD-10-CM | POA: Diagnosis not present

## 2020-12-25 LAB — COMPREHENSIVE METABOLIC PANEL
Albumin: 4.1 (ref 3.5–5.0)
Calcium: 8.9 (ref 8.7–10.7)

## 2020-12-25 LAB — BASIC METABOLIC PANEL
BUN: 19 (ref 4–21)
CO2: 28 — AB (ref 13–22)
Chloride: 101 (ref 99–108)
Creatinine: 0.7 (ref 0.5–1.1)
Glucose: 109
Potassium: 3.6 (ref 3.4–5.3)
Sodium: 137 (ref 137–147)

## 2020-12-25 LAB — HEPATIC FUNCTION PANEL
ALT: 17 (ref 7–35)
AST: 33 (ref 13–35)
Alkaline Phosphatase: 127 — AB (ref 25–125)
Bilirubin, Total: 0.4

## 2020-12-25 LAB — LACTATE DEHYDROGENASE: LDH: 148 U/L (ref 98–192)

## 2020-12-25 LAB — CBC AND DIFFERENTIAL
HCT: 37 (ref 36–46)
Hemoglobin: 12.3 (ref 12.0–16.0)
Neutrophils Absolute: 7.82
Platelets: 222 (ref 150–399)
WBC: 9.9

## 2020-12-25 LAB — IRON AND TIBC
Iron: 65 ug/dL (ref 28–170)
Saturation Ratios: 16 % (ref 10.4–31.8)
TIBC: 408 ug/dL (ref 250–450)
UIBC: 343 ug/dL

## 2020-12-25 LAB — FERRITIN: Ferritin: 18 ng/mL (ref 11–307)

## 2020-12-25 LAB — CBC: RBC: 3.96 (ref 3.87–5.11)

## 2020-12-26 ENCOUNTER — Telehealth: Payer: Self-pay

## 2020-12-26 NOTE — Telephone Encounter (Signed)
Pt called asking for her LDH result from yesterday. Dr Bobby Rumpf reviewed the [place], which is 148. He states that the LDH is normal, he will f/u with her in 6 months.  Pt notified & verbalized understanding.

## 2020-12-30 NOTE — Addendum Note (Signed)
Addended by: Lavera Guise A on: 12/30/2020 09:43 AM   Modules accepted: Orders

## 2021-02-05 DIAGNOSIS — C8510 Unspecified B-cell lymphoma, unspecified site: Secondary | ICD-10-CM | POA: Diagnosis not present

## 2021-02-05 DIAGNOSIS — M159 Polyosteoarthritis, unspecified: Secondary | ICD-10-CM | POA: Diagnosis not present

## 2021-02-05 DIAGNOSIS — B370 Candidal stomatitis: Secondary | ICD-10-CM | POA: Diagnosis not present

## 2021-02-05 DIAGNOSIS — I5032 Chronic diastolic (congestive) heart failure: Secondary | ICD-10-CM | POA: Diagnosis not present

## 2021-02-05 DIAGNOSIS — I10 Essential (primary) hypertension: Secondary | ICD-10-CM | POA: Diagnosis not present

## 2021-02-05 DIAGNOSIS — J9611 Chronic respiratory failure with hypoxia: Secondary | ICD-10-CM | POA: Diagnosis not present

## 2021-02-05 DIAGNOSIS — E039 Hypothyroidism, unspecified: Secondary | ICD-10-CM | POA: Diagnosis not present

## 2021-02-05 DIAGNOSIS — Z9981 Dependence on supplemental oxygen: Secondary | ICD-10-CM | POA: Diagnosis not present

## 2021-02-05 DIAGNOSIS — D509 Iron deficiency anemia, unspecified: Secondary | ICD-10-CM | POA: Diagnosis not present

## 2021-02-05 DIAGNOSIS — J449 Chronic obstructive pulmonary disease, unspecified: Secondary | ICD-10-CM | POA: Diagnosis not present

## 2021-02-05 DIAGNOSIS — F321 Major depressive disorder, single episode, moderate: Secondary | ICD-10-CM | POA: Diagnosis not present

## 2021-04-02 DIAGNOSIS — J9611 Chronic respiratory failure with hypoxia: Secondary | ICD-10-CM | POA: Diagnosis not present

## 2021-04-02 DIAGNOSIS — E039 Hypothyroidism, unspecified: Secondary | ICD-10-CM | POA: Diagnosis not present

## 2021-04-02 DIAGNOSIS — Z9981 Dependence on supplemental oxygen: Secondary | ICD-10-CM | POA: Diagnosis not present

## 2021-04-02 DIAGNOSIS — I10 Essential (primary) hypertension: Secondary | ICD-10-CM | POA: Diagnosis not present

## 2021-04-02 DIAGNOSIS — F321 Major depressive disorder, single episode, moderate: Secondary | ICD-10-CM | POA: Diagnosis not present

## 2021-04-02 DIAGNOSIS — J449 Chronic obstructive pulmonary disease, unspecified: Secondary | ICD-10-CM | POA: Diagnosis not present

## 2021-04-02 DIAGNOSIS — Z7189 Other specified counseling: Secondary | ICD-10-CM | POA: Diagnosis not present

## 2021-04-02 DIAGNOSIS — C8510 Unspecified B-cell lymphoma, unspecified site: Secondary | ICD-10-CM | POA: Diagnosis not present

## 2021-04-02 DIAGNOSIS — I5032 Chronic diastolic (congestive) heart failure: Secondary | ICD-10-CM | POA: Diagnosis not present

## 2021-04-02 DIAGNOSIS — Z6822 Body mass index (BMI) 22.0-22.9, adult: Secondary | ICD-10-CM | POA: Diagnosis not present

## 2021-04-02 DIAGNOSIS — R531 Weakness: Secondary | ICD-10-CM | POA: Diagnosis not present

## 2021-04-02 DIAGNOSIS — E538 Deficiency of other specified B group vitamins: Secondary | ICD-10-CM | POA: Diagnosis not present

## 2021-04-02 DIAGNOSIS — J019 Acute sinusitis, unspecified: Secondary | ICD-10-CM | POA: Diagnosis not present

## 2021-06-01 DIAGNOSIS — I272 Pulmonary hypertension, unspecified: Secondary | ICD-10-CM | POA: Diagnosis not present

## 2021-06-01 DIAGNOSIS — Z9981 Dependence on supplemental oxygen: Secondary | ICD-10-CM | POA: Diagnosis not present

## 2021-06-01 DIAGNOSIS — M1990 Unspecified osteoarthritis, unspecified site: Secondary | ICD-10-CM | POA: Diagnosis not present

## 2021-06-01 DIAGNOSIS — R634 Abnormal weight loss: Secondary | ICD-10-CM | POA: Diagnosis not present

## 2021-06-01 DIAGNOSIS — I5032 Chronic diastolic (congestive) heart failure: Secondary | ICD-10-CM | POA: Diagnosis not present

## 2021-06-01 DIAGNOSIS — I110 Hypertensive heart disease with heart failure: Secondary | ICD-10-CM | POA: Diagnosis not present

## 2021-06-01 DIAGNOSIS — K219 Gastro-esophageal reflux disease without esophagitis: Secondary | ICD-10-CM | POA: Diagnosis not present

## 2021-06-01 DIAGNOSIS — E039 Hypothyroidism, unspecified: Secondary | ICD-10-CM | POA: Diagnosis not present

## 2021-06-01 DIAGNOSIS — F32A Depression, unspecified: Secondary | ICD-10-CM | POA: Diagnosis not present

## 2021-06-01 DIAGNOSIS — Z8572 Personal history of non-Hodgkin lymphomas: Secondary | ICD-10-CM | POA: Diagnosis not present

## 2021-06-01 DIAGNOSIS — J449 Chronic obstructive pulmonary disease, unspecified: Secondary | ICD-10-CM | POA: Diagnosis not present

## 2021-06-03 DIAGNOSIS — Z9981 Dependence on supplemental oxygen: Secondary | ICD-10-CM | POA: Diagnosis not present

## 2021-06-03 DIAGNOSIS — F32A Depression, unspecified: Secondary | ICD-10-CM | POA: Diagnosis not present

## 2021-06-03 DIAGNOSIS — J449 Chronic obstructive pulmonary disease, unspecified: Secondary | ICD-10-CM | POA: Diagnosis not present

## 2021-06-03 DIAGNOSIS — I272 Pulmonary hypertension, unspecified: Secondary | ICD-10-CM | POA: Diagnosis not present

## 2021-06-03 DIAGNOSIS — I110 Hypertensive heart disease with heart failure: Secondary | ICD-10-CM | POA: Diagnosis not present

## 2021-06-03 DIAGNOSIS — I5032 Chronic diastolic (congestive) heart failure: Secondary | ICD-10-CM | POA: Diagnosis not present

## 2021-06-04 DIAGNOSIS — J449 Chronic obstructive pulmonary disease, unspecified: Secondary | ICD-10-CM | POA: Diagnosis not present

## 2021-06-04 DIAGNOSIS — I110 Hypertensive heart disease with heart failure: Secondary | ICD-10-CM | POA: Diagnosis not present

## 2021-06-04 DIAGNOSIS — Z9981 Dependence on supplemental oxygen: Secondary | ICD-10-CM | POA: Diagnosis not present

## 2021-06-04 DIAGNOSIS — I5032 Chronic diastolic (congestive) heart failure: Secondary | ICD-10-CM | POA: Diagnosis not present

## 2021-06-04 DIAGNOSIS — I272 Pulmonary hypertension, unspecified: Secondary | ICD-10-CM | POA: Diagnosis not present

## 2021-06-04 DIAGNOSIS — F32A Depression, unspecified: Secondary | ICD-10-CM | POA: Diagnosis not present

## 2021-06-12 DIAGNOSIS — I110 Hypertensive heart disease with heart failure: Secondary | ICD-10-CM | POA: Diagnosis not present

## 2021-06-12 DIAGNOSIS — I5032 Chronic diastolic (congestive) heart failure: Secondary | ICD-10-CM | POA: Diagnosis not present

## 2021-06-12 DIAGNOSIS — Z9981 Dependence on supplemental oxygen: Secondary | ICD-10-CM | POA: Diagnosis not present

## 2021-06-12 DIAGNOSIS — I272 Pulmonary hypertension, unspecified: Secondary | ICD-10-CM | POA: Diagnosis not present

## 2021-06-12 DIAGNOSIS — F32A Depression, unspecified: Secondary | ICD-10-CM | POA: Diagnosis not present

## 2021-06-12 DIAGNOSIS — J449 Chronic obstructive pulmonary disease, unspecified: Secondary | ICD-10-CM | POA: Diagnosis not present

## 2021-06-13 DIAGNOSIS — Z9981 Dependence on supplemental oxygen: Secondary | ICD-10-CM | POA: Diagnosis not present

## 2021-06-13 DIAGNOSIS — F32A Depression, unspecified: Secondary | ICD-10-CM | POA: Diagnosis not present

## 2021-06-13 DIAGNOSIS — I110 Hypertensive heart disease with heart failure: Secondary | ICD-10-CM | POA: Diagnosis not present

## 2021-06-13 DIAGNOSIS — J449 Chronic obstructive pulmonary disease, unspecified: Secondary | ICD-10-CM | POA: Diagnosis not present

## 2021-06-13 DIAGNOSIS — I272 Pulmonary hypertension, unspecified: Secondary | ICD-10-CM | POA: Diagnosis not present

## 2021-06-13 DIAGNOSIS — I5032 Chronic diastolic (congestive) heart failure: Secondary | ICD-10-CM | POA: Diagnosis not present

## 2021-06-14 DIAGNOSIS — F32A Depression, unspecified: Secondary | ICD-10-CM | POA: Diagnosis not present

## 2021-06-14 DIAGNOSIS — K219 Gastro-esophageal reflux disease without esophagitis: Secondary | ICD-10-CM | POA: Diagnosis not present

## 2021-06-14 DIAGNOSIS — I272 Pulmonary hypertension, unspecified: Secondary | ICD-10-CM | POA: Diagnosis not present

## 2021-06-14 DIAGNOSIS — Z9981 Dependence on supplemental oxygen: Secondary | ICD-10-CM | POA: Diagnosis not present

## 2021-06-14 DIAGNOSIS — E039 Hypothyroidism, unspecified: Secondary | ICD-10-CM | POA: Diagnosis not present

## 2021-06-14 DIAGNOSIS — Z8572 Personal history of non-Hodgkin lymphomas: Secondary | ICD-10-CM | POA: Diagnosis not present

## 2021-06-14 DIAGNOSIS — I110 Hypertensive heart disease with heart failure: Secondary | ICD-10-CM | POA: Diagnosis not present

## 2021-06-14 DIAGNOSIS — I5032 Chronic diastolic (congestive) heart failure: Secondary | ICD-10-CM | POA: Diagnosis not present

## 2021-06-14 DIAGNOSIS — J449 Chronic obstructive pulmonary disease, unspecified: Secondary | ICD-10-CM | POA: Diagnosis not present

## 2021-06-14 DIAGNOSIS — R634 Abnormal weight loss: Secondary | ICD-10-CM | POA: Diagnosis not present

## 2021-06-14 DIAGNOSIS — M1990 Unspecified osteoarthritis, unspecified site: Secondary | ICD-10-CM | POA: Diagnosis not present

## 2021-06-20 DIAGNOSIS — I5032 Chronic diastolic (congestive) heart failure: Secondary | ICD-10-CM | POA: Diagnosis not present

## 2021-06-20 DIAGNOSIS — J449 Chronic obstructive pulmonary disease, unspecified: Secondary | ICD-10-CM | POA: Diagnosis not present

## 2021-06-20 DIAGNOSIS — F32A Depression, unspecified: Secondary | ICD-10-CM | POA: Diagnosis not present

## 2021-06-20 DIAGNOSIS — Z9981 Dependence on supplemental oxygen: Secondary | ICD-10-CM | POA: Diagnosis not present

## 2021-06-20 DIAGNOSIS — I272 Pulmonary hypertension, unspecified: Secondary | ICD-10-CM | POA: Diagnosis not present

## 2021-06-20 DIAGNOSIS — I110 Hypertensive heart disease with heart failure: Secondary | ICD-10-CM | POA: Diagnosis not present

## 2021-06-21 DIAGNOSIS — I272 Pulmonary hypertension, unspecified: Secondary | ICD-10-CM | POA: Diagnosis not present

## 2021-06-21 DIAGNOSIS — I110 Hypertensive heart disease with heart failure: Secondary | ICD-10-CM | POA: Diagnosis not present

## 2021-06-21 DIAGNOSIS — F32A Depression, unspecified: Secondary | ICD-10-CM | POA: Diagnosis not present

## 2021-06-21 DIAGNOSIS — I5032 Chronic diastolic (congestive) heart failure: Secondary | ICD-10-CM | POA: Diagnosis not present

## 2021-06-21 DIAGNOSIS — Z9981 Dependence on supplemental oxygen: Secondary | ICD-10-CM | POA: Diagnosis not present

## 2021-06-21 DIAGNOSIS — J449 Chronic obstructive pulmonary disease, unspecified: Secondary | ICD-10-CM | POA: Diagnosis not present

## 2021-06-27 DIAGNOSIS — I5032 Chronic diastolic (congestive) heart failure: Secondary | ICD-10-CM | POA: Diagnosis not present

## 2021-06-27 DIAGNOSIS — Z9981 Dependence on supplemental oxygen: Secondary | ICD-10-CM | POA: Diagnosis not present

## 2021-06-27 DIAGNOSIS — J449 Chronic obstructive pulmonary disease, unspecified: Secondary | ICD-10-CM | POA: Diagnosis not present

## 2021-06-27 DIAGNOSIS — I272 Pulmonary hypertension, unspecified: Secondary | ICD-10-CM | POA: Diagnosis not present

## 2021-06-27 DIAGNOSIS — F32A Depression, unspecified: Secondary | ICD-10-CM | POA: Diagnosis not present

## 2021-06-27 DIAGNOSIS — I110 Hypertensive heart disease with heart failure: Secondary | ICD-10-CM | POA: Diagnosis not present

## 2021-06-28 ENCOUNTER — Inpatient Hospital Stay

## 2021-06-28 ENCOUNTER — Inpatient Hospital Stay: Attending: Oncology | Admitting: Oncology

## 2021-07-02 DIAGNOSIS — I272 Pulmonary hypertension, unspecified: Secondary | ICD-10-CM | POA: Diagnosis not present

## 2021-07-02 DIAGNOSIS — F32A Depression, unspecified: Secondary | ICD-10-CM | POA: Diagnosis not present

## 2021-07-02 DIAGNOSIS — I5032 Chronic diastolic (congestive) heart failure: Secondary | ICD-10-CM | POA: Diagnosis not present

## 2021-07-02 DIAGNOSIS — I110 Hypertensive heart disease with heart failure: Secondary | ICD-10-CM | POA: Diagnosis not present

## 2021-07-02 DIAGNOSIS — J449 Chronic obstructive pulmonary disease, unspecified: Secondary | ICD-10-CM | POA: Diagnosis not present

## 2021-07-02 DIAGNOSIS — Z9981 Dependence on supplemental oxygen: Secondary | ICD-10-CM | POA: Diagnosis not present

## 2021-07-04 DIAGNOSIS — I272 Pulmonary hypertension, unspecified: Secondary | ICD-10-CM | POA: Diagnosis not present

## 2021-07-04 DIAGNOSIS — I110 Hypertensive heart disease with heart failure: Secondary | ICD-10-CM | POA: Diagnosis not present

## 2021-07-04 DIAGNOSIS — J449 Chronic obstructive pulmonary disease, unspecified: Secondary | ICD-10-CM | POA: Diagnosis not present

## 2021-07-04 DIAGNOSIS — F32A Depression, unspecified: Secondary | ICD-10-CM | POA: Diagnosis not present

## 2021-07-04 DIAGNOSIS — Z9981 Dependence on supplemental oxygen: Secondary | ICD-10-CM | POA: Diagnosis not present

## 2021-07-04 DIAGNOSIS — I5032 Chronic diastolic (congestive) heart failure: Secondary | ICD-10-CM | POA: Diagnosis not present

## 2021-07-10 DIAGNOSIS — F32A Depression, unspecified: Secondary | ICD-10-CM | POA: Diagnosis not present

## 2021-07-10 DIAGNOSIS — I272 Pulmonary hypertension, unspecified: Secondary | ICD-10-CM | POA: Diagnosis not present

## 2021-07-10 DIAGNOSIS — I110 Hypertensive heart disease with heart failure: Secondary | ICD-10-CM | POA: Diagnosis not present

## 2021-07-10 DIAGNOSIS — Z9981 Dependence on supplemental oxygen: Secondary | ICD-10-CM | POA: Diagnosis not present

## 2021-07-10 DIAGNOSIS — I5032 Chronic diastolic (congestive) heart failure: Secondary | ICD-10-CM | POA: Diagnosis not present

## 2021-07-10 DIAGNOSIS — J449 Chronic obstructive pulmonary disease, unspecified: Secondary | ICD-10-CM | POA: Diagnosis not present

## 2021-07-11 DIAGNOSIS — J449 Chronic obstructive pulmonary disease, unspecified: Secondary | ICD-10-CM | POA: Diagnosis not present

## 2021-07-11 DIAGNOSIS — I5032 Chronic diastolic (congestive) heart failure: Secondary | ICD-10-CM | POA: Diagnosis not present

## 2021-07-11 DIAGNOSIS — Z9981 Dependence on supplemental oxygen: Secondary | ICD-10-CM | POA: Diagnosis not present

## 2021-07-11 DIAGNOSIS — I272 Pulmonary hypertension, unspecified: Secondary | ICD-10-CM | POA: Diagnosis not present

## 2021-07-11 DIAGNOSIS — I110 Hypertensive heart disease with heart failure: Secondary | ICD-10-CM | POA: Diagnosis not present

## 2021-07-11 DIAGNOSIS — F32A Depression, unspecified: Secondary | ICD-10-CM | POA: Diagnosis not present

## 2021-07-14 DIAGNOSIS — E039 Hypothyroidism, unspecified: Secondary | ICD-10-CM | POA: Diagnosis not present

## 2021-07-14 DIAGNOSIS — R634 Abnormal weight loss: Secondary | ICD-10-CM | POA: Diagnosis not present

## 2021-07-14 DIAGNOSIS — I272 Pulmonary hypertension, unspecified: Secondary | ICD-10-CM | POA: Diagnosis not present

## 2021-07-14 DIAGNOSIS — M1990 Unspecified osteoarthritis, unspecified site: Secondary | ICD-10-CM | POA: Diagnosis not present

## 2021-07-14 DIAGNOSIS — J449 Chronic obstructive pulmonary disease, unspecified: Secondary | ICD-10-CM | POA: Diagnosis not present

## 2021-07-14 DIAGNOSIS — K219 Gastro-esophageal reflux disease without esophagitis: Secondary | ICD-10-CM | POA: Diagnosis not present

## 2021-07-14 DIAGNOSIS — I5032 Chronic diastolic (congestive) heart failure: Secondary | ICD-10-CM | POA: Diagnosis not present

## 2021-07-14 DIAGNOSIS — Z9981 Dependence on supplemental oxygen: Secondary | ICD-10-CM | POA: Diagnosis not present

## 2021-07-14 DIAGNOSIS — Z8572 Personal history of non-Hodgkin lymphomas: Secondary | ICD-10-CM | POA: Diagnosis not present

## 2021-07-14 DIAGNOSIS — I110 Hypertensive heart disease with heart failure: Secondary | ICD-10-CM | POA: Diagnosis not present

## 2021-07-14 DIAGNOSIS — F32A Depression, unspecified: Secondary | ICD-10-CM | POA: Diagnosis not present

## 2021-07-18 DIAGNOSIS — Z9981 Dependence on supplemental oxygen: Secondary | ICD-10-CM | POA: Diagnosis not present

## 2021-07-18 DIAGNOSIS — F32A Depression, unspecified: Secondary | ICD-10-CM | POA: Diagnosis not present

## 2021-07-18 DIAGNOSIS — I272 Pulmonary hypertension, unspecified: Secondary | ICD-10-CM | POA: Diagnosis not present

## 2021-07-18 DIAGNOSIS — I5032 Chronic diastolic (congestive) heart failure: Secondary | ICD-10-CM | POA: Diagnosis not present

## 2021-07-18 DIAGNOSIS — I110 Hypertensive heart disease with heart failure: Secondary | ICD-10-CM | POA: Diagnosis not present

## 2021-07-18 DIAGNOSIS — J449 Chronic obstructive pulmonary disease, unspecified: Secondary | ICD-10-CM | POA: Diagnosis not present

## 2021-07-20 DIAGNOSIS — J449 Chronic obstructive pulmonary disease, unspecified: Secondary | ICD-10-CM | POA: Diagnosis not present

## 2021-07-20 DIAGNOSIS — I110 Hypertensive heart disease with heart failure: Secondary | ICD-10-CM | POA: Diagnosis not present

## 2021-07-20 DIAGNOSIS — F32A Depression, unspecified: Secondary | ICD-10-CM | POA: Diagnosis not present

## 2021-07-20 DIAGNOSIS — I5032 Chronic diastolic (congestive) heart failure: Secondary | ICD-10-CM | POA: Diagnosis not present

## 2021-07-20 DIAGNOSIS — Z9981 Dependence on supplemental oxygen: Secondary | ICD-10-CM | POA: Diagnosis not present

## 2021-07-20 DIAGNOSIS — I272 Pulmonary hypertension, unspecified: Secondary | ICD-10-CM | POA: Diagnosis not present

## 2021-07-24 DIAGNOSIS — I272 Pulmonary hypertension, unspecified: Secondary | ICD-10-CM | POA: Diagnosis not present

## 2021-07-24 DIAGNOSIS — I110 Hypertensive heart disease with heart failure: Secondary | ICD-10-CM | POA: Diagnosis not present

## 2021-07-24 DIAGNOSIS — Z9981 Dependence on supplemental oxygen: Secondary | ICD-10-CM | POA: Diagnosis not present

## 2021-07-24 DIAGNOSIS — F32A Depression, unspecified: Secondary | ICD-10-CM | POA: Diagnosis not present

## 2021-07-24 DIAGNOSIS — J449 Chronic obstructive pulmonary disease, unspecified: Secondary | ICD-10-CM | POA: Diagnosis not present

## 2021-07-24 DIAGNOSIS — I5032 Chronic diastolic (congestive) heart failure: Secondary | ICD-10-CM | POA: Diagnosis not present

## 2021-07-27 DIAGNOSIS — F32A Depression, unspecified: Secondary | ICD-10-CM | POA: Diagnosis not present

## 2021-07-27 DIAGNOSIS — I272 Pulmonary hypertension, unspecified: Secondary | ICD-10-CM | POA: Diagnosis not present

## 2021-07-27 DIAGNOSIS — J449 Chronic obstructive pulmonary disease, unspecified: Secondary | ICD-10-CM | POA: Diagnosis not present

## 2021-07-27 DIAGNOSIS — Z9981 Dependence on supplemental oxygen: Secondary | ICD-10-CM | POA: Diagnosis not present

## 2021-07-27 DIAGNOSIS — I110 Hypertensive heart disease with heart failure: Secondary | ICD-10-CM | POA: Diagnosis not present

## 2021-07-27 DIAGNOSIS — I5032 Chronic diastolic (congestive) heart failure: Secondary | ICD-10-CM | POA: Diagnosis not present

## 2021-07-28 DIAGNOSIS — F32A Depression, unspecified: Secondary | ICD-10-CM | POA: Diagnosis not present

## 2021-07-28 DIAGNOSIS — Z9981 Dependence on supplemental oxygen: Secondary | ICD-10-CM | POA: Diagnosis not present

## 2021-07-28 DIAGNOSIS — J449 Chronic obstructive pulmonary disease, unspecified: Secondary | ICD-10-CM | POA: Diagnosis not present

## 2021-07-28 DIAGNOSIS — I110 Hypertensive heart disease with heart failure: Secondary | ICD-10-CM | POA: Diagnosis not present

## 2021-07-28 DIAGNOSIS — I5032 Chronic diastolic (congestive) heart failure: Secondary | ICD-10-CM | POA: Diagnosis not present

## 2021-07-28 DIAGNOSIS — I272 Pulmonary hypertension, unspecified: Secondary | ICD-10-CM | POA: Diagnosis not present

## 2021-07-31 DIAGNOSIS — Z9981 Dependence on supplemental oxygen: Secondary | ICD-10-CM | POA: Diagnosis not present

## 2021-07-31 DIAGNOSIS — J449 Chronic obstructive pulmonary disease, unspecified: Secondary | ICD-10-CM | POA: Diagnosis not present

## 2021-07-31 DIAGNOSIS — I272 Pulmonary hypertension, unspecified: Secondary | ICD-10-CM | POA: Diagnosis not present

## 2021-07-31 DIAGNOSIS — F32A Depression, unspecified: Secondary | ICD-10-CM | POA: Diagnosis not present

## 2021-07-31 DIAGNOSIS — I5032 Chronic diastolic (congestive) heart failure: Secondary | ICD-10-CM | POA: Diagnosis not present

## 2021-07-31 DIAGNOSIS — I110 Hypertensive heart disease with heart failure: Secondary | ICD-10-CM | POA: Diagnosis not present

## 2021-08-03 DIAGNOSIS — Z9981 Dependence on supplemental oxygen: Secondary | ICD-10-CM | POA: Diagnosis not present

## 2021-08-03 DIAGNOSIS — I110 Hypertensive heart disease with heart failure: Secondary | ICD-10-CM | POA: Diagnosis not present

## 2021-08-03 DIAGNOSIS — I272 Pulmonary hypertension, unspecified: Secondary | ICD-10-CM | POA: Diagnosis not present

## 2021-08-03 DIAGNOSIS — J449 Chronic obstructive pulmonary disease, unspecified: Secondary | ICD-10-CM | POA: Diagnosis not present

## 2021-08-03 DIAGNOSIS — I5032 Chronic diastolic (congestive) heart failure: Secondary | ICD-10-CM | POA: Diagnosis not present

## 2021-08-03 DIAGNOSIS — F32A Depression, unspecified: Secondary | ICD-10-CM | POA: Diagnosis not present

## 2021-08-10 DIAGNOSIS — F32A Depression, unspecified: Secondary | ICD-10-CM | POA: Diagnosis not present

## 2021-08-10 DIAGNOSIS — I272 Pulmonary hypertension, unspecified: Secondary | ICD-10-CM | POA: Diagnosis not present

## 2021-08-10 DIAGNOSIS — Z9981 Dependence on supplemental oxygen: Secondary | ICD-10-CM | POA: Diagnosis not present

## 2021-08-10 DIAGNOSIS — I110 Hypertensive heart disease with heart failure: Secondary | ICD-10-CM | POA: Diagnosis not present

## 2021-08-10 DIAGNOSIS — J449 Chronic obstructive pulmonary disease, unspecified: Secondary | ICD-10-CM | POA: Diagnosis not present

## 2021-08-10 DIAGNOSIS — I5032 Chronic diastolic (congestive) heart failure: Secondary | ICD-10-CM | POA: Diagnosis not present

## 2021-08-13 DIAGNOSIS — I110 Hypertensive heart disease with heart failure: Secondary | ICD-10-CM | POA: Diagnosis not present

## 2021-08-13 DIAGNOSIS — Z9981 Dependence on supplemental oxygen: Secondary | ICD-10-CM | POA: Diagnosis not present

## 2021-08-13 DIAGNOSIS — J449 Chronic obstructive pulmonary disease, unspecified: Secondary | ICD-10-CM | POA: Diagnosis not present

## 2021-08-13 DIAGNOSIS — I5032 Chronic diastolic (congestive) heart failure: Secondary | ICD-10-CM | POA: Diagnosis not present

## 2021-08-13 DIAGNOSIS — F32A Depression, unspecified: Secondary | ICD-10-CM | POA: Diagnosis not present

## 2021-08-13 DIAGNOSIS — I272 Pulmonary hypertension, unspecified: Secondary | ICD-10-CM | POA: Diagnosis not present

## 2021-08-14 DIAGNOSIS — K219 Gastro-esophageal reflux disease without esophagitis: Secondary | ICD-10-CM | POA: Diagnosis not present

## 2021-08-14 DIAGNOSIS — F32A Depression, unspecified: Secondary | ICD-10-CM | POA: Diagnosis not present

## 2021-08-14 DIAGNOSIS — J449 Chronic obstructive pulmonary disease, unspecified: Secondary | ICD-10-CM | POA: Diagnosis not present

## 2021-08-14 DIAGNOSIS — Z9981 Dependence on supplemental oxygen: Secondary | ICD-10-CM | POA: Diagnosis not present

## 2021-08-14 DIAGNOSIS — I5032 Chronic diastolic (congestive) heart failure: Secondary | ICD-10-CM | POA: Diagnosis not present

## 2021-08-14 DIAGNOSIS — M1990 Unspecified osteoarthritis, unspecified site: Secondary | ICD-10-CM | POA: Diagnosis not present

## 2021-08-14 DIAGNOSIS — R634 Abnormal weight loss: Secondary | ICD-10-CM | POA: Diagnosis not present

## 2021-08-14 DIAGNOSIS — I110 Hypertensive heart disease with heart failure: Secondary | ICD-10-CM | POA: Diagnosis not present

## 2021-08-14 DIAGNOSIS — Z8572 Personal history of non-Hodgkin lymphomas: Secondary | ICD-10-CM | POA: Diagnosis not present

## 2021-08-14 DIAGNOSIS — E039 Hypothyroidism, unspecified: Secondary | ICD-10-CM | POA: Diagnosis not present

## 2021-08-14 DIAGNOSIS — I272 Pulmonary hypertension, unspecified: Secondary | ICD-10-CM | POA: Diagnosis not present

## 2021-08-15 DIAGNOSIS — I5032 Chronic diastolic (congestive) heart failure: Secondary | ICD-10-CM | POA: Diagnosis not present

## 2021-08-15 DIAGNOSIS — I272 Pulmonary hypertension, unspecified: Secondary | ICD-10-CM | POA: Diagnosis not present

## 2021-08-15 DIAGNOSIS — Z9981 Dependence on supplemental oxygen: Secondary | ICD-10-CM | POA: Diagnosis not present

## 2021-08-15 DIAGNOSIS — J449 Chronic obstructive pulmonary disease, unspecified: Secondary | ICD-10-CM | POA: Diagnosis not present

## 2021-08-15 DIAGNOSIS — F32A Depression, unspecified: Secondary | ICD-10-CM | POA: Diagnosis not present

## 2021-08-15 DIAGNOSIS — I110 Hypertensive heart disease with heart failure: Secondary | ICD-10-CM | POA: Diagnosis not present

## 2021-08-17 DIAGNOSIS — Z9981 Dependence on supplemental oxygen: Secondary | ICD-10-CM | POA: Diagnosis not present

## 2021-08-17 DIAGNOSIS — I5032 Chronic diastolic (congestive) heart failure: Secondary | ICD-10-CM | POA: Diagnosis not present

## 2021-08-17 DIAGNOSIS — J449 Chronic obstructive pulmonary disease, unspecified: Secondary | ICD-10-CM | POA: Diagnosis not present

## 2021-08-17 DIAGNOSIS — I272 Pulmonary hypertension, unspecified: Secondary | ICD-10-CM | POA: Diagnosis not present

## 2021-08-17 DIAGNOSIS — F32A Depression, unspecified: Secondary | ICD-10-CM | POA: Diagnosis not present

## 2021-08-17 DIAGNOSIS — I110 Hypertensive heart disease with heart failure: Secondary | ICD-10-CM | POA: Diagnosis not present

## 2021-08-19 DIAGNOSIS — I5032 Chronic diastolic (congestive) heart failure: Secondary | ICD-10-CM | POA: Diagnosis not present

## 2021-08-19 DIAGNOSIS — I272 Pulmonary hypertension, unspecified: Secondary | ICD-10-CM | POA: Diagnosis not present

## 2021-08-19 DIAGNOSIS — J449 Chronic obstructive pulmonary disease, unspecified: Secondary | ICD-10-CM | POA: Diagnosis not present

## 2021-08-19 DIAGNOSIS — F32A Depression, unspecified: Secondary | ICD-10-CM | POA: Diagnosis not present

## 2021-08-19 DIAGNOSIS — Z9981 Dependence on supplemental oxygen: Secondary | ICD-10-CM | POA: Diagnosis not present

## 2021-08-19 DIAGNOSIS — I110 Hypertensive heart disease with heart failure: Secondary | ICD-10-CM | POA: Diagnosis not present

## 2021-09-13 DEATH — deceased

## 2021-12-12 ENCOUNTER — Other Ambulatory Visit: Payer: Self-pay
